# Patient Record
Sex: Female | Born: 1975 | Hispanic: Yes | Marital: Married | Smoking: Never smoker
Health system: Southern US, Community
[De-identification: ages and names within clinical notes are randomized; demographics above are authoritative.]

## PROBLEM LIST (undated history)

## (undated) DIAGNOSIS — I10 Essential (primary) hypertension: Secondary | ICD-10-CM

## (undated) DIAGNOSIS — R519 Headache, unspecified: Secondary | ICD-10-CM

## (undated) DIAGNOSIS — G8929 Other chronic pain: Secondary | ICD-10-CM

## (undated) HISTORY — PX: TUBAL LIGATION: SHX77

## (undated) HISTORY — DX: Other chronic pain: G89.29

## (undated) HISTORY — DX: Headache, unspecified: R51.9

## (undated) HISTORY — DX: Essential (primary) hypertension: I10

---

## 1997-12-13 ENCOUNTER — Encounter: Admission: RE | Admit: 1997-12-13 | Discharge: 1998-03-13 | Payer: Self-pay | Admitting: *Deleted

## 1998-04-12 ENCOUNTER — Encounter (HOSPITAL_COMMUNITY): Admission: RE | Admit: 1998-04-12 | Discharge: 1998-07-11 | Payer: Self-pay | Admitting: *Deleted

## 1998-07-22 ENCOUNTER — Encounter (HOSPITAL_COMMUNITY): Admission: RE | Admit: 1998-07-22 | Discharge: 1998-10-20 | Payer: Self-pay | Admitting: *Deleted

## 1998-11-12 ENCOUNTER — Encounter (HOSPITAL_COMMUNITY): Admission: RE | Admit: 1998-11-12 | Discharge: 1999-02-10 | Payer: Self-pay | Admitting: *Deleted

## 1999-01-12 ENCOUNTER — Other Ambulatory Visit: Admission: RE | Admit: 1999-01-12 | Discharge: 1999-01-12 | Payer: Self-pay | Admitting: Gynecology

## 2001-03-19 ENCOUNTER — Other Ambulatory Visit: Admission: RE | Admit: 2001-03-19 | Discharge: 2001-03-19 | Payer: Self-pay | Admitting: Gynecology

## 2001-03-26 ENCOUNTER — Encounter: Admission: RE | Admit: 2001-03-26 | Discharge: 2001-06-24 | Payer: Self-pay | Admitting: Gynecology

## 2001-11-07 ENCOUNTER — Inpatient Hospital Stay (HOSPITAL_COMMUNITY): Admission: AD | Admit: 2001-11-07 | Discharge: 2001-11-09 | Payer: Self-pay | Admitting: Gynecology

## 2001-11-10 ENCOUNTER — Encounter: Admission: RE | Admit: 2001-11-10 | Discharge: 2001-12-10 | Payer: Self-pay | Admitting: Gynecology

## 2001-12-11 ENCOUNTER — Encounter: Admission: RE | Admit: 2001-12-11 | Discharge: 2002-01-10 | Payer: Self-pay | Admitting: Gynecology

## 2001-12-19 ENCOUNTER — Other Ambulatory Visit: Admission: RE | Admit: 2001-12-19 | Discharge: 2001-12-19 | Payer: Self-pay | Admitting: Gynecology

## 2002-01-11 ENCOUNTER — Encounter: Admission: RE | Admit: 2002-01-11 | Discharge: 2002-02-10 | Payer: Self-pay | Admitting: Gynecology

## 2002-02-11 ENCOUNTER — Encounter: Admission: RE | Admit: 2002-02-11 | Discharge: 2002-03-13 | Payer: Self-pay | Admitting: Gynecology

## 2002-04-13 ENCOUNTER — Encounter: Admission: RE | Admit: 2002-04-13 | Discharge: 2002-05-13 | Payer: Self-pay | Admitting: Gynecology

## 2002-06-13 ENCOUNTER — Encounter: Admission: RE | Admit: 2002-06-13 | Discharge: 2002-07-13 | Payer: Self-pay | Admitting: Gynecology

## 2002-07-14 ENCOUNTER — Encounter: Admission: RE | Admit: 2002-07-14 | Discharge: 2002-08-13 | Payer: Self-pay | Admitting: Gynecology

## 2003-12-02 ENCOUNTER — Other Ambulatory Visit: Admission: RE | Admit: 2003-12-02 | Discharge: 2003-12-02 | Payer: Self-pay | Admitting: Obstetrics and Gynecology

## 2005-03-15 ENCOUNTER — Other Ambulatory Visit: Admission: RE | Admit: 2005-03-15 | Discharge: 2005-03-15 | Payer: Self-pay | Admitting: Obstetrics and Gynecology

## 2005-10-12 ENCOUNTER — Other Ambulatory Visit: Admission: RE | Admit: 2005-10-12 | Discharge: 2005-10-12 | Payer: Self-pay | Admitting: Gynecology

## 2006-10-15 ENCOUNTER — Other Ambulatory Visit: Admission: RE | Admit: 2006-10-15 | Discharge: 2006-10-15 | Payer: Self-pay | Admitting: Gynecology

## 2008-09-07 ENCOUNTER — Encounter: Payer: Self-pay | Admitting: Gynecology

## 2008-09-07 ENCOUNTER — Ambulatory Visit: Payer: Self-pay | Admitting: Gynecology

## 2008-09-07 ENCOUNTER — Other Ambulatory Visit: Admission: RE | Admit: 2008-09-07 | Discharge: 2008-09-07 | Payer: Self-pay | Admitting: Gynecology

## 2009-01-04 ENCOUNTER — Ambulatory Visit: Payer: Self-pay | Admitting: Gynecology

## 2009-01-18 ENCOUNTER — Ambulatory Visit: Payer: Self-pay | Admitting: Gynecology

## 2009-07-29 ENCOUNTER — Inpatient Hospital Stay (HOSPITAL_COMMUNITY): Admission: AD | Admit: 2009-07-29 | Discharge: 2009-07-29 | Payer: Self-pay | Admitting: Obstetrics and Gynecology

## 2009-08-15 ENCOUNTER — Inpatient Hospital Stay (HOSPITAL_COMMUNITY): Admission: RE | Admit: 2009-08-15 | Discharge: 2009-08-17 | Payer: Self-pay | Admitting: Obstetrics and Gynecology

## 2011-02-15 LAB — CBC
HCT: 33.4 % — ABNORMAL LOW (ref 36.0–46.0)
HCT: 40.2 % (ref 36.0–46.0)
Hemoglobin: 11.5 g/dL — ABNORMAL LOW (ref 12.0–15.0)
Hemoglobin: 13.7 g/dL (ref 12.0–15.0)
MCHC: 34.3 g/dL (ref 30.0–36.0)
MCV: 90.1 fL (ref 78.0–100.0)
Platelets: 177 K/uL (ref 150–400)
RBC: 3.71 MIL/uL — ABNORMAL LOW (ref 3.87–5.11)
RDW: 13.3 % (ref 11.5–15.5)
RDW: 13.4 % (ref 11.5–15.5)
WBC: 11.7 K/uL — ABNORMAL HIGH (ref 4.0–10.5)
WBC: 9.4 10*3/uL (ref 4.0–10.5)

## 2011-02-15 LAB — SYPHILIS: RPR W/REFLEX TO RPR TITER AND TREPONEMAL ANTIBODIES, TRADITIONAL SCREENING AND DIAGNOSIS ALGORITHM: RPR Ser Ql: NONREACTIVE

## 2011-02-16 LAB — COMPREHENSIVE METABOLIC PANEL
AST: 24 U/L (ref 0–37)
Albumin: 2.9 g/dL — ABNORMAL LOW (ref 3.5–5.2)
Calcium: 8.7 mg/dL (ref 8.4–10.5)
Creatinine, Ser: 0.39 mg/dL — ABNORMAL LOW (ref 0.4–1.2)
GFR calc Af Amer: >60 mL/min (ref >60)

## 2011-02-16 LAB — CBC
MCHC: 34.3 g/dL (ref 30.0–36.0)
MCV: 90 fL (ref 78.0–100.0)
Platelets: 200 10*3/uL (ref 150–400)
RDW: 13.4 % (ref 11.5–15.5)

## 2011-02-16 LAB — URIC ACID: Uric Acid, Serum: 4.3 mg/dL (ref 2.4–7.0)

## 2011-03-30 NOTE — H&P (Signed)
NAMESHARMANE, DAME NO.:  [id number]   MEDICAL RECORD NO.:  [id number]         PATIENT TYPE:  WINP   LOCATION:  147                           FACILITY:  [facility]   PHYSICIAN:  Dineen Kid. Rana Snare, M.D.    DATE OF BIRTH:  [birth year]   DATE OF ADMISSION:  08/15/2009  DATE OF DISCHARGE:                              HISTORY & PHYSICAL   HISTORY OF PRESENT ILLNESS:  Ms. Jasmine Dominguez is a 35 year old G4, P3 with a  estimated date confinement of August 25, 2009.  She presented to my  office this afternoon complaining of regular strong contractions every 5-  10 minutes apart to the point of being unable to talk and having to  breathe through the contractions.  On examination, her cervix was  changed to 2-3 cm, 50% effaced, previously it was closed.  Contractions  have been going on most of the day and were getting stronger and closer.  She had 3 previous cesarean sections, desires repeat.  She also has  multiparity, desires sterility.  She presents for repeat cesarean  section and tubal ligation for labor.  Her pregnancy was uncomplicated.   PAST SURGICAL HISTORY:  Significant for 3 cesarean sections by Dr.  Lily Peer.  The first for failure to progress.   She is negative GBS.   Her blood pressure is 110/64.  Heart is regular rate and rhythm.  Lungs  clear to auscultation bilaterally.  Abdomen is gravid, nontender.  Cervix is 350%, -2 station.   IMPRESSION AND PLAN:  Previous cesarean section x3 with desires repeat,  labor and pregnancy at 38-1/2 weeks' gestational age, also multiparity  desires sterility.   PLAN:  Repeat low segment transverse cesarean section and tubal ligation  by Filshie clip.  Discussed the risks and benefits of procedure at  length, which include but not limited to risk of infection, bleeding,  damage to the uterus, tubes, ovary, bowel, bladder, fetus.  Risk of  tubal failure quoted at 03/999.  She does give an informed consent and  wishes to  proceed.      Dineen Kid Rana Snare, M.D.  Electronically Signed     DCL/MEDQ  D:  08/15/2009  T:  08/16/2009  Job:  [id number]

## 2011-03-30 NOTE — Op Note (Signed)
[facility]  Patient:    Jasmine Dominguez, Jasmine Dominguez Visit Number: [id number] MRN: [id number]          Service Type: OBS Location: 910A 9107 [id number] Attending Physician:  Tonye Royalty Dictated by:   Gaetano Hawthorne. Lily Peer, M.D. Admit Date:  11/07/2001                             Operative Report  SURGEON:                      Juan H. Lily Peer, M.D.  FIRST ASSISTANT:              Katy Fitch, M.D.  INDICATIONS FOR PROCEDURE:    A 35 year old gravida 3, para 2, AB 1 at 57 weeks estimated gestational age with previous low vertical cesarean section delivered at 29 weeks secondary to preeclampsia/toxemia.  PREOPERATIVE DIAGNOSES:       1. Term intrauterine pregnancy at 41 weeks                                  estimated gestational age.                               2. Previous low vertical cesarean section for                                  repeat.  POSTOPERATIVE DIAGNOSES:      1. Term intrauterine pregnancy at 5 weeks                                  estimated gestational age.                               2. Previous low vertical cesarean section for                                  repeat.  ANESTHESIA:                   Spinal.  PROCEDURE:                    Repeat low uterine segment transverse cesarean section.  FINDINGS:                     Viable female infant.  Apgars 8 and 9.  Weight 6 lb 9 oz.  Pelvic adhesions consisting of omentum adhered to the anterior peritoneal surface.  Normal maternal tubes and ovaries.  Clear amniotic fluid.  DESCRIPTION OF PROCEDURE:     After the patient was adequately counseled, she was taken to the operating room, where she underwent spinal anesthesia.  A Foley was inserted to monitor urinary output.  The abdomen was prepped and draped in the usual sterile fashion.  A Pfannenstiel skin incision was made adjacent to the previous cesarean section scar.  The Pfannenstiel incision was extended through the  skin and subcutaneous tissue to the rectus fascia, where a midline nick was made.  The fascia  was incised in a transverse fashion.  The peritoneal cavity was entered cautiously.  A bladder flap was established. Upon entering the peritoneal cavity, meticulous dissection was required to remove a small segment of the omentum, which was adhered to the anterior peritoneal surface.  Once the lower uterine segment was incised, clear amniotic fluid was present.  The newborn was delivered without any complications.  The nasopharyngeal area was bulb suctioned.  The cord was doubly clamped and excised.  The newborn gave an immediate cry, was suctioned on the peritoneum and handed off to the pediatricians, who gave the above-mentioned parameters.  Cefotan 1 g IV was administered.  The placenta was delivered from the intrauterine cavity.  The uterus was exteriorized.  The intrauterine cavity was swept clear of any remaining products of conception. The lower uterine segment transverse incision was closed with a running stitch of 0 Vicryl suture.  Both tubes and ovaries were normal.  The uterus was placed back into the abdominal cavity.  The pelvic cavity was copiously irrigated with normal saline solution.  After ascertaining adequate hemostasis, closure was commenced.  The visceral peritoneum was now reapproximated.  The rectus fascia was closed with a running stitch of 0 Vicryl suture.  Subcutaneous bleeders were cauterized.  The skin was reapproximated with skin clips, followed by Xeroform gauze and 4 x dressing. The patient was transferred to the recovery room with stable vital signs. BLOOD LOSS:                   600 cc.  IV FLUIDS:                    3300 cc lactated Ringers.  URINE OUTPUT:                 350 cc clear. Dictated by:   Gaetano Hawthorne Lily Peer, M.D. Attending Physician:  Tonye Royalty DD:  11/07/01 TD:  11/07/01 Job: [id number]

## 2011-03-30 NOTE — Discharge Summary (Signed)
[facility]  Patient:    Jasmine Dominguez, GARROTT Visit Number: [id number] MRN: [id number]          Service Type: OBS Location: 910A 9107 01 Attending Physician:  Tonye Royalty Dictated by:   Antony Contras, [facility] Admit Date:  11/07/2001 Discharge Date: 11/09/2001                             Discharge Summary  DISCHARGE DIAGNOSES:          1. Term intrauterine pregnancy at 39 weeks.                               2. Previous low vertical cesarean section with                                  delivery of viable infant.  HISTORY OF PRESENT ILLNESS:   The patient is a 35 year old gravida 3, para 1-1-0-2, LMP January 23, 2001, [facility] November 15, 2001 by ultrasound.  Risk factors include history of previous low cervical transverse cesarean section.  LABS:                         Blood type 0 positive, antibody screen negative. RPR, HBSAG, HIV nonreactive.  Rubella nonimmune.  MSAFP normal.  HOSPITAL COURSE/TREATMENT:    The patient was admitted on November 07, 2001. Repeat low uterine segment transverse cesarean sections were performed by Dr. Lily Peer, assisted by Dr. Katy Fitch.  Findings included viable female infant with Apgars of 8 and 9, weight 6 pounds 9 ounces.  Pelvic adhesions consisting of omentum adhering to the anterior peritoneal surface. Normal maternal tubes and ovaries.  Clear amniotic fluid.  Postpartum course the patient remained afebrile and had no difficulty voiding. She was able to be discharged in satisfactory condition on the second postpartum day.  DISCHARGE LABS:               CBC -- Hematocrit 30.1, hemoglobin 10.6, platelets 202.  DISPOSITION:                  Follow-up on November 11, 2001 in the office for removal of staples, then four to six weeks postpartum examination.  Continue with prenatal vitamins and iron. Dictated by:   Antony Contras, [facility] Attending Physician:  Tonye Royalty DD:   11/26/01 TD:  11/26/01 Job: [id number]

## 2011-03-30 NOTE — H&P (Signed)
[facility]  Patient:    Jasmine Dominguez, Jasmine Dominguez Visit Number: [id number] MRN: [id number]          Service Type: Attending:  Gaetano Hawthorne. Lily Peer, M.D. Dictated by:   Gaetano Hawthorne Lily Peer, M.D. Adm. Date:  11/07/01                           History and Physical  CHIEF COMPLAINT:              1. Term intrauterine pregnancy at 76 weeks                                  estimated gestational age.                               2. Previous low vertical cesarean section for                                  repeat.  HISTORY:                      The patient is a 35 year old gravida 3, para 1 with corrected estimated date of confinement November 15, 2001.  Patient will be 39 weeks at the time of her cesarean section.  The reason for patients repeat cesarean section is due to the fact that her last pregnancy due to severe preeclampsia and toxemia at [redacted] weeks gestation had an emergency cesarean section and was a low vertical incision.  Her previous pregnancy prior to that was a normal spontaneous vaginal delivery.  This pregnancy her prenatal course has essentially been unremarkable.  Had normal weight gain of 20 pounds and her blood sugars were fine and GBS culture was negative.  PAST MEDICAL HISTORY:         1. As per above.                               2. [facility] in 1996.  ALLERGIES:                    Denies.  REVIEW OF SYSTEMS:            See ______ form.  PHYSICAL EXAMINATION  VITAL SIGNS:                  Blood pressure 120/74, weight 180 pounds.  LABORATORIES:                 Urine negative for protein and glucose.  HEENT:                        Unremarkable.  NECK:                         Supple.  Trachea midline.  No carotid bruits. No thyromegaly.  LUNGS:                        Clear to auscultation without rhonchi or wheezes.  HEART:  Regular rate and rhythm.  No murmurs or gallops.  BREASTS:                      Not  done.  ABDOMEN:                      Gravid uterus.  Midline scar was noted. Positive fetal heart tones.  PELVIC:                       Cervix:  1 cm, 50% effaced, -3 station.  EXTREMITIES:                  DTRs 1+.  Negative clonus.  Trace edema.  PRENATAL LABORATORIES:        O+ blood type.  Negative antibody screen.  VDRL was nonreactive.  Rubella immune.  Hepatitis B surface antigen, HIV were negative.  Alpha fetoprotein was normal.  Diabetes screen was normal.  GBS culture was negative.  Pap smear was normal.  LABORATORIES:                 Urine negative for protein and glucose.  ASSESSMENT:                   A 35 year old gravida 3, para 1 at [redacted] weeks gestation at time of her scheduled repeat cesarean section on December 27. Reason for repeat cesarean section is due to the fact that her last pregnancy ended up in emergency cesarean section at [redacted] weeks gestation for severe preeclampsia.  Risks, benefits, pros, and cons of the operation were discussed with the patient to include infection, bleeding, trauma to internal organs, and trauma to the fetus.  She is not interested in having permanent sterilization.  All of the above instructions were provided to the patient in Spanish.  All questions were answered.  Will follow accordingly.  PLAN:                         Patient is scheduled for repeat cesarean section on Friday, December 27 at 1 p.m. at [facility]. Dictated by:   Gaetano Hawthorne. Lily Peer, M.D. Attending:  Gaetano Hawthorne. Lily Peer, M.D. DD:  10/31/01 TD:  10/31/01 Job: [id number]

## 2012-10-06 ENCOUNTER — Ambulatory Visit: Payer: Self-pay | Admitting: Gynecology

## 2013-12-30 ENCOUNTER — Other Ambulatory Visit (HOSPITAL_COMMUNITY)
Admission: RE | Admit: 2013-12-30 | Discharge: 2013-12-30 | Disposition: A | Discharge status: Home / Self Care | Payer: Self-pay | Source: Ambulatory Visit | Attending: Gynecology | Admitting: Gynecology

## 2013-12-30 ENCOUNTER — Ambulatory Visit (INDEPENDENT_AMBULATORY_CARE_PROVIDER_SITE_OTHER): Payer: Self-pay | Admitting: Gynecology

## 2013-12-30 ENCOUNTER — Encounter: Payer: Self-pay | Admitting: Gynecology

## 2013-12-30 VITALS — BP 130/86 | Ht 60.5 in | Wt 172.8 lb

## 2013-12-30 DIAGNOSIS — Z1151 Encounter for screening for human papillomavirus (HPV): Secondary | ICD-10-CM | POA: Insufficient documentation

## 2013-12-30 DIAGNOSIS — R635 Abnormal weight gain: Secondary | ICD-10-CM

## 2013-12-30 DIAGNOSIS — Z01419 Encounter for gynecological examination (general) (routine) without abnormal findings: Secondary | ICD-10-CM | POA: Insufficient documentation

## 2013-12-30 LAB — COMPREHENSIVE METABOLIC PANEL
ALK PHOS: 78 U/L (ref 39–117)
ALT: 14 U/L (ref 0–35)
AST: 14 U/L (ref 0–37)
Albumin: 4.3 g/dL (ref 3.5–5.2)
BILIRUBIN TOTAL: 0.5 mg/dL (ref 0.2–1.2)
BUN: 8 mg/dL (ref 6–23)
CO2: 27 mEq/L (ref 19–32)
Calcium: 8.7 mg/dL (ref 8.4–10.5)
Chloride: 102 mEq/L (ref 96–112)
Creat: 0.45 mg/dL — ABNORMAL LOW (ref 0.50–1.10)
GLUCOSE: 79 mg/dL (ref 70–99)
Potassium: 3.8 mEq/L (ref 3.5–5.3)
SODIUM: 136 meq/L (ref 135–145)
TOTAL PROTEIN: 7.3 g/dL (ref 6.0–8.3)

## 2013-12-30 LAB — CBC WITH DIFFERENTIAL/PLATELET
BASOS ABS: 0 10*3/uL (ref 0.0–0.1)
BASOS PCT: 0 % (ref 0–1)
EOS ABS: 0.1 10*3/uL (ref 0.0–0.7)
Eosinophils Relative: 2 % (ref 0–5)
HCT: 38.6 % (ref 36.0–46.0)
HEMOGLOBIN: 12.7 g/dL (ref 12.0–15.0)
Lymphocytes Relative: 26 % (ref 12–46)
Lymphs Abs: 1.4 10*3/uL (ref 0.7–4.0)
MCH: 28.6 pg (ref 26.0–34.0)
MCHC: 32.9 g/dL (ref 30.0–36.0)
MCV: 86.9 fL (ref 78.0–100.0)
MONOS PCT: 7 % (ref 3–12)
Monocytes Absolute: 0.4 10*3/uL (ref 0.1–1.0)
NEUTROS PCT: 65 % (ref 43–77)
Neutro Abs: 3.6 10*3/uL (ref 1.7–7.7)
Platelets: 311 10*3/uL (ref 150–400)
RBC: 4.44 MIL/uL (ref 3.87–5.11)
RDW: 14.5 % (ref 11.5–15.5)
WBC: 5.5 10*3/uL (ref 4.0–10.5)

## 2013-12-30 LAB — LIPID PANEL
Cholesterol: 164 mg/dL (ref 0–200)
HDL: 44 mg/dL (ref >39)
LDL CALC: 92 mg/dL (ref 0–99)
TRIGLYCERIDES: 139 mg/dL (ref <150)
Total CHOL/HDL Ratio: 3.7 Ratio
VLDL: 28 mg/dL (ref 0–40)

## 2013-12-30 LAB — TSH: TSH: 1.075 u[IU]/mL (ref 0.350–4.500)

## 2013-12-30 NOTE — Patient Instructions (Signed)
Autoexamen de mamas  (Breast Self-Awareness) El autoexamen de mamas puede detectar problemas de manera temprana, prevenir complicaciones mdicas significativas y posiblemente salvar su vida. Al hacerlo, podr familiarizarse con el aspecto y forma de sus mamas, y observar cambios. Esto le permite descubrir cambios de manera precoz. Este autoexamen mensual le ofrece la tranquilidad de que sus senos estn en buen estado de salud. Una forma de aprender qu es normal para sus mamas y si sufren modificaciones es hacer un autoexamen.   Si encuentra un bulto o algo que no estaba presente anteriormente, lo mejor es ponerse en contacto con su mdico inmediatamente. Otro hallazgo que debe ser evaluado por su mdico es la secrecin del pezn, especialmente si es con sangre; cambios en la piel o enrojecimiento; reas donde la piel parece estar tironeada (retrada) o nuevos bultos o protuberancias. El dolor en los senos es rara vez se asocia con el cncer (malignidad), pero tambin debe ser evaluado por un mdico.  CMO REALIZAR EL AUTOEXAMEN DE MAMAS  El mejor momento para examinar sus mamas es a los 5 a 7 das despus de finalizado el perodo menstrual. Durante la menstruacin, las mamas estn ms abultadas y puede haber ms dificultad para encontrar modificaciones. Si no menstra, ha llegado a la menopausia, o le han extirpado el tero (histerectomia), usted debe examinar sus senos a intervalos regulares, por ejemplo cada mes. Si est amamantando, examine sus senos despus de alimentar al beb o despus de usar un extractor de leche. Los implantes mamarios no disminuyen el riesgo de bultos o tumores, por lo que debe seguir realizando el autoexamen de mamas como se recomienda. Hable con su mdico acerca de cmo determinar la diferencia entre el implante y el tejido mamario. Adems, debe consultar cuanta presin debe hacer durante el examen. Con el tiempo se familiarizar con las variaciones de las mamas y se sentir ms  cmoda para realizar el examen. Para el autoexamen deber quitarse toda la ropa de la cintura para arriba.  1.  Observe sus senos y pezones. Prese frente a un espejo en una habitacin con buena iluminacin. Con las manos en las caderas, presione las manos firmemente hacia abajo. Busque diferencias en la forma, el contorno y el tamao de un pecho al otro (asimetras). Entre las asimetras se incluyen arrugas, depresiones o protuberancias. Tambin, busque cambios en la piel, como reas enrojecidas o escamosas. Busque cambios en los pezones, como secreciones, hoyuelos, cambios en la posicin, o enrojecimiento. 2. Palpe cuidadosamente sus senos. Es mucho mejor hacerlo en la ducha o en la baera, mientras usa jabn o cuando est recostada sobre su espalda. Coloque el brazo (en el lado de la mama que se examina) por arriba de la cabeza. Use las yemas (no las puntas) de los tres dedos centrales de la mano opuesta para palpar. Comience en la zona de la axila, haga crculos de  de pulgada (2 cm) y vaya superponindolos. Utilice 3 niveles diferentes de presin (ligero, medio y firme) en cada crculo antes de pasar al siguiente. Se necesita una presin ligera para sentir los tejidos ms cercanos a la piel. La presin media ayudar a sentir el tejido mamario un poco ms profundo, mientras que se necesita una presin firme para palpar el tejido que se encuentra cerca de las costillas. Continuar superponiendo crculos y vaya hacia abajo, hasta sentir las costillas, por debajo del pecho. Luego mueva un espacio del ancho de un dedo hacia el centro del cuerpo. Siga con los crculos del  de pulgada (  2 cm) mientras va lentamente hacia la clavcula, cerca de la base del cuello. Contine con el examen hacia arriba y hacia abajo con las 3 intensidades de presin hasta llegar a la mitad del pecho. Hgalo con cada seno cuidadosamente, buscando bultos o modificaciones. 3. Debe llevar un registro escrito con los cambios o los hallazgos  normales que encuentre para cada seno. Si registra esta informacin, no tiene que depender slo de la memoria para recordar el tamao, la sensibilidad o la ubicacin de los hallazgos. Anote en qu momento se encuentra del ciclo menstrual, si usted todava est menstruando. El tejido mamario puede tener algunos bultos o tejidos engrosados. Sin embargo, consulte a su mdico si usted encuentra algo que le preocupa.   SOLICITE ATENCIN MDICA SI:   Observa cambios en la forma, en el contorno o el tamao de las mamas o los pezones.   Hay modificaciones en la piel, como zonas enrojecidas o escamosas en las mamas o en los pezones.   Tiene una secrecin anormal en los pezones.   Siente un nuevo bulto o reas engrosadas de manera anormal.  Document Released: 10/29/2005 Document Revised: 10/15/2012 [employer] Patient Information 2014 [employer].  

## 2013-12-30 NOTE — Progress Notes (Signed)
Jasmine Dominguez [birth year] [id number]   History:    38 y.o.  for annual gyn exam without any complaints. Patient has not been seen in the office in over 4 years. She is a gravida 4 para 4 (1 normal spontaneous vaginal delivery followed by 3 cesarean sections whereby the last one resulted in a tubal sterilization). Patient denied any prior history of abnormal Pap smear. Was complaining only of some weight gain. Normal menstrual cycles reported. Patient not interested in the flu vaccine.  Past medical history,surgical history, family history and social history were all reviewed and documented in the EPIC chart.  Gynecologic History Patient's last menstrual period was 12/19/2013. Contraception: tubal ligation Last Pap: Over 4 years ago. Results were: normal Last mammogram: Not indicated. Results were: Not indicated  Obstetric History OB History  Gravida Para Term Preterm AB SAB TAB Ectopic Multiple Living  4 4        4     # Outcome Date GA Lbr Len/2nd Weight Sex Delivery Anes PTL Lv  4 PAR           3 PAR           2 PAR           1 PAR                ROS: A ROS was performed and pertinent positives and negatives are included in the history.  GENERAL: No fevers or chills. HEENT: No change in vision, no earache, sore throat or sinus congestion. NECK: No pain or stiffness. CARDIOVASCULAR: No chest pain or pressure. No palpitations. PULMONARY: No shortness of breath, cough or wheeze. GASTROINTESTINAL: No abdominal pain, nausea, vomiting or diarrhea, melena or bright red blood per rectum. GENITOURINARY: No urinary frequency, urgency, hesitancy or dysuria. MUSCULOSKELETAL: No joint or muscle pain, no back pain, no recent trauma. DERMATOLOGIC: No rash, no itching, no lesions. ENDOCRINE: No polyuria, polydipsia, no heat or cold intolerance. No recent change in weight. HEMATOLOGICAL: No anemia or easy bruising or bleeding. NEUROLOGIC: No headache, seizures, numbness, tingling or weakness.  PSYCHIATRIC: No depression, no loss of interest in normal activity or change in sleep pattern.     Exam: chaperone present  BP 130/86  Ht 5' 0.5" (1.537 m)  Wt 172 lb 12.8 oz (78.382 kg)  BMI 33.18 kg/m2  LMP 12/19/2013  Body mass index is 33.18 kg/(m^2).  General appearance : Well developed well nourished female. No acute distress HEENT: Neck supple, trachea midline, no carotid bruits, no thyroidmegaly Lungs: Clear to auscultation, no rhonchi or wheezes, or rib retractions  Heart: Regular rate and rhythm, no murmurs or gallops Breast:Examined in sitting and supine position were symmetrical in appearance, no palpable masses or tenderness,  no skin retraction, no nipple inversion, no nipple discharge, no skin discoloration, no axillary or supraclavicular lymphadenopathy Abdomen: no palpable masses or tenderness, no rebound or guarding Extremities: no edema or skin discoloration or tenderness  Pelvic:  Bartholin, Urethra, Skene Glands: Within normal limits             Vagina: No gross lesions or discharge  Cervix: No gross lesions or discharge  Uterus  anteverted, normal size, shape and consistency, non-tender and mobile  Adnexa  Without masses or tenderness  Anus and perineum  normal   Rectovaginal  normal sphincter tone without palpated masses or tenderness             Hemoccult not indicated     Assessment/Plan:  38  y.o. female for annual exam who was reminded today on the importance of monthly self breast exam. Patient declined flu vaccine. The following labs were ordered and a fasting state: Fasting lipid profile, comprehensive metabolic panel, TSH, CBC, and urinalysis. Pap smear was done today.  Note: This dictation was prepared with  Dragon/digital dictation along withSmart phrase technology. Any transcriptional errors that result from this process are unintentional.   Ok EdwardsFERNANDEZ,JUAN H MD, 11:22 AM 12/30/2013

## 2013-12-31 LAB — URINALYSIS W MICROSCOPIC + REFLEX CULTURE
BILIRUBIN URINE: NEGATIVE
Casts: NONE SEEN
Crystals: NONE SEEN
Glucose, UA: NEGATIVE mg/dL
KETONES UR: NEGATIVE mg/dL
Leukocytes, UA: NEGATIVE
Nitrite: NEGATIVE
PROTEIN: NEGATIVE mg/dL
Specific Gravity, Urine: 1.016 (ref 1.005–1.030)
UROBILINOGEN UA: 0.2 mg/dL (ref 0.0–1.0)
pH: 8 (ref 5.0–8.0)

## 2014-01-01 ENCOUNTER — Other Ambulatory Visit: Payer: Self-pay | Admitting: Gynecology

## 2014-01-01 MED ORDER — NITROFURANTOIN MONOHYD MACRO 100 MG PO CAPS: 100.0000 mg | ORAL_CAPSULE | Freq: Two times a day (BID) | ORAL | Status: DC

## 2014-01-02 LAB — URINE CULTURE

## 2014-09-13 ENCOUNTER — Encounter: Payer: Self-pay | Admitting: Gynecology

## 2017-03-27 ENCOUNTER — Encounter: Payer: Self-pay | Admitting: Gynecology

## 2019-06-09 ENCOUNTER — Other Ambulatory Visit: Payer: Self-pay

## 2019-06-09 DIAGNOSIS — Z20822 Contact with and (suspected) exposure to covid-19: Secondary | ICD-10-CM

## 2019-06-11 LAB — NOVEL CORONAVIRUS, NAA: SARS-CoV-2, NAA: NOT DETECTED

## 2021-02-22 ENCOUNTER — Encounter (HOSPITAL_COMMUNITY): Payer: Self-pay

## 2021-02-22 ENCOUNTER — Other Ambulatory Visit: Payer: Self-pay

## 2021-02-22 ENCOUNTER — Ambulatory Visit (HOSPITAL_COMMUNITY)
Admission: EM | Admit: 2021-02-22 | Discharge: 2021-02-22 | Disposition: A | Discharge status: Home / Self Care | Payer: 59

## 2021-02-22 DIAGNOSIS — J209 Acute bronchitis, unspecified: Secondary | ICD-10-CM

## 2021-02-22 DIAGNOSIS — J3089 Other allergic rhinitis: Secondary | ICD-10-CM

## 2021-02-22 MED ORDER — PROMETHAZINE-DM 6.25-15 MG/5ML PO SYRP: 5.0000 mL | ORAL_SOLUTION | Freq: Four times a day (QID) | ORAL | 0 refills | Status: DC | PRN

## 2021-02-22 MED ORDER — PREDNISONE 20 MG PO TABS: 40.0000 mg | ORAL_TABLET | Freq: Every day | ORAL | 0 refills | Status: DC

## 2021-02-22 MED ORDER — CETIRIZINE HCL 10 MG PO TABS: 10.0000 mg | ORAL_TABLET | Freq: Every day | ORAL | 2 refills | Status: DC

## 2021-02-22 NOTE — ED Triage Notes (Signed)
Pt c/o a cough x 4 months. She states she was COVID positive in January after coming back from [place]. Pt states she was given medication that helped. She states the cough went away and came back. She states she left to [place] again, came back and has been coughing ever since. She states she has been using an inhaler.

## 2021-02-23 NOTE — ED Provider Notes (Signed)
[facility]    CSN: [id number] Arrival date & time: 02/22/21  1826      History   Chief Complaint Chief Complaint  Patient presents with  . Cough    HPI Jasmine Dominguez is a 45 y.o. female.   Patient presenting today with a persistent cough x4 months.  She states that she did have Covid after coming back from [place] in January and the cough never went away.  Worse first thing in the morning and at night, sometimes dry, sometimes productive.  Also has some postnasal drainage associated with the cough.  Denies fever, chills, chest pain, shortness of breath, palpitations.  Has tried numerous over-the-counter remedies such as Delsym, Robitussin, DayQuil, NyQuil without benefit.  No known history of chronic cardiopulmonary or allergic conditions.  No new sick contacts.    Past Medical History:  Diagnosis Date  . NSVD (normal spontaneous vaginal delivery)     There are no problems to display for this patient.   Past Surgical History:  Procedure Laterality Date  . CESAREAN SECTION     X 3... severe preeclampsia 29 wks  . TUBAL LIGATION      OB History    Gravida  4   Para  4   Term      Preterm      AB      Living  4     SAB      IAB      Ectopic      Multiple      Live Births               Home Medications    Prior to Admission medications   Medication Sig Start Date End Date Taking? Authorizing Provider  cetirizine (ZYRTEC ALLERGY) 10 MG tablet Take 1 tablet (10 mg total) by mouth daily. 02/22/21  Yes Particia Nearing, PA-C  hydrochlorothiazide (HYDRODIURIL) 25 MG tablet Take 25 mg by mouth daily.   Yes [provider]  predniSONE (DELTASONE) 20 MG tablet Take 2 tablets (40 mg total) by mouth daily with breakfast. 02/22/21  Yes Particia Nearing, PA-C  promethazine-dextromethorphan (PROMETHAZINE-DM) 6.25-15 MG/5ML syrup Take 5 mLs by mouth 4 (four) times daily as needed for cough. 02/22/21  Yes Particia Nearing,  PA-C  amLODipine (NORVASC) 5 MG tablet Take 1 tablet by mouth daily. 12/07/20   [provider]  azithromycin (ZITHROMAX) 250 MG tablet See admin instructions. follow package directions 12/07/20   [provider]  Multiple Vitamin (MULTIVITAMIN) tablet Take 1 tablet by mouth daily.    [provider]  nitrofurantoin, macrocrystal-monohydrate, (MACROBID) 100 MG capsule Take 1 capsule (100 mg total) by mouth 2 (two) times daily. 01/01/14   Ok Edwards, MD    Family History Family History  Problem Relation Age of Onset  . Diabetes Mother   . Hypertension Mother   . Diabetes Father   . Hypertension Father   . Heart disease Sister   . Heart disease Brother     Social History Social History   Tobacco Use  . Smoking status: Never Smoker  . Smokeless tobacco: Never Used  Substance Use Topics  . Alcohol use: No  . Drug use: No     Allergies   Patient has no known allergies.   Review of Systems Review of Systems Per HPI  Physical Exam Triage Vital Signs ED Triage Vitals  Enc Vitals Group     BP 02/22/21 1901 (!) 173/93  Pulse Rate 02/22/21 1856 95     Resp 02/22/21 1856 18     Temp --      Temp src --      SpO2 02/22/21 1856 100 %     Weight --      Height --      Head Circumference --      Peak Flow --      Pain Score 02/22/21 1853 6     Pain Loc --      Pain Edu? --      Excl. in GC? --    No data found.  Updated Vital Signs BP (!) 173/93 (BP Location: Right Arm)   Pulse 95   Resp 18   LMP 02/05/2021 (Exact Date)   SpO2 100%   Visual Acuity Right Eye Distance:   Left Eye Distance:   Bilateral Distance:    Right Eye Near:   Left Eye Near:    Bilateral Near:     Physical Exam Vitals and nursing note reviewed.  Constitutional:      Appearance: Normal appearance. She is not ill-appearing.  HENT:     Head: Atraumatic.     Right Ear: Tympanic membrane normal.     Left Ear: Tympanic membrane normal.     Nose:  Rhinorrhea present.     Comments: Bilateral nasal turbinates boggy, erythematous, edematous    Mouth/Throat:     Mouth: Mucous membranes are moist.     Pharynx: Oropharynx is clear. Posterior oropharyngeal erythema present.  Eyes:     Extraocular Movements: Extraocular movements intact.     Conjunctiva/sclera: Conjunctivae normal.  Cardiovascular:     Rate and Rhythm: Normal rate and regular rhythm.     Heart sounds: Normal heart sounds.  Pulmonary:     Effort: Pulmonary effort is normal. No respiratory distress.     Breath sounds: Normal breath sounds. No wheezing or rales.  Chest:     Chest wall: No tenderness.  Abdominal:     General: Bowel sounds are normal. There is no distension.     Palpations: Abdomen is soft.     Tenderness: There is no abdominal tenderness. There is no right CVA tenderness, left CVA tenderness or guarding.  Musculoskeletal:        General: Normal range of motion.     Cervical back: Normal range of motion and neck supple.  Skin:    General: Skin is warm and dry.  Neurological:     Mental Status: She is alert and oriented to person, place, and time.  Psychiatric:        Mood and Affect: Mood normal.        Thought Content: Thought content normal.        Judgment: Judgment normal.    UC Treatments / Results  Labs (all labs ordered are listed, but only abnormal results are displayed) Labs Reviewed - No data to display  EKG  Radiology No results found.  Procedures Procedures (including critical care time)  Medications Ordered in UC Medications - No data to display  Initial Impression / Assessment and Plan / UC Course  I have reviewed the triage vital signs and the nursing notes.  Pertinent labs & imaging results that were available during my care of the patient were reviewed by me and considered in my medical decision making (see chart for details).     Possibly a postinfectious cough post Covid but given the duration and seasonality  likely related to  allergic rhinitis, postnasal drip.  Mildly hypertensive today but otherwise vital signs including O2 saturation excellent within normal limits.  Lungs clear to auscultation bilaterally, heart sounds normal.  Will trial a prednisone burst, start good allergy regimen with Zyrtec and Flonase and give Phenergan DM for cough.  Did recommend that she follow-up with a primary care to establish care soon as possible for check of her elevated blood pressure and her chronic cough.  Information given on this and.  DASH diet, exercise, stress reduction reviewed and to check home readings if able.  Return for acutely worsening symptoms  Final Clinical Impressions(s) / UC Diagnoses   Final diagnoses:  Acute bronchitis, unspecified organism  Seasonal allergic rhinitis due to other allergic trigger   Discharge Instructions   None    ED Prescriptions    Medication Sig Dispense Auth. Provider   predniSONE (DELTASONE) 20 MG tablet Take 2 tablets (40 mg total) by mouth daily with breakfast. 10 tablet Particia Nearing, PA-C   cetirizine (ZYRTEC ALLERGY) 10 MG tablet Take 1 tablet (10 mg total) by mouth daily. 30 tablet Particia Nearing, [place]   promethazine-dextromethorphan (PROMETHAZINE-DM) 6.25-15 MG/5ML syrup Take 5 mLs by mouth 4 (four) times daily as needed for cough. 100 mL Particia Nearing, [place]     PDMP not reviewed this encounter.   Particia Nearing, [place] 02/23/21 1305

## 2021-03-24 ENCOUNTER — Ambulatory Visit (HOSPITAL_COMMUNITY)
Admission: EM | Admit: 2021-03-24 | Discharge: 2021-03-24 | Disposition: A | Discharge status: Home / Self Care | Payer: 59 | Attending: Student | Admitting: Student

## 2021-03-24 ENCOUNTER — Other Ambulatory Visit: Payer: Self-pay

## 2021-03-24 ENCOUNTER — Encounter (HOSPITAL_COMMUNITY): Payer: Self-pay | Admitting: Emergency Medicine

## 2021-03-24 DIAGNOSIS — Z8616 Personal history of COVID-19: Secondary | ICD-10-CM | POA: Diagnosis not present

## 2021-03-24 DIAGNOSIS — J209 Acute bronchitis, unspecified: Secondary | ICD-10-CM

## 2021-03-24 MED ORDER — PREDNISONE 20 MG PO TABS: 40.0000 mg | ORAL_TABLET | Freq: Every day | ORAL | 0 refills | Status: AC

## 2021-03-24 MED ORDER — ALBUTEROL SULFATE HFA 108 (90 BASE) MCG/ACT IN AERS: 1.0000 | INHALATION_SPRAY | Freq: Four times a day (QID) | RESPIRATORY_TRACT | 0 refills | Status: DC | PRN

## 2021-03-24 NOTE — ED Provider Notes (Signed)
[facility]    CSN: [id number] Arrival date & time: 03/24/21  1221      History   Chief Complaint Chief Complaint  Patient presents with  . Cough    HPI Jasmine Dominguez is a 45 y.o. female presenting with cough x10 days. Recurrent bronchitis and shortness of breath following covid diagnosis 11/2020.  Medical history hypertension, Covid 11/2020.  This patient states that she was initially evaluated for her symptoms when she was traveling in [place] 3/22, they did an x-ray and told her she had "swelling in her lungs".  States that she has been treated with multiple rounds of prednisone, this was last done at our urgent care 1 month ago with improvement in her symptoms at that time.  States she had relief for about 2 weeks, but the cough returned about 10 days ago.  Today she is presenting with 10 days of frequent productive cough with clear mucus.  Some shortness of breath with exertion.  Denies wheezing, dizziness, chest pain, nausea/vomiting/diarrhea.  States she is taking Zyrtec but does not think that she actually has allergies.  States hydrocodone cough syrup is the only thing that has helped her, and she thinks she needs an antibiotic today.  HPI  Past Medical History:  Diagnosis Date  . NSVD (normal spontaneous vaginal delivery)     There are no problems to display for this patient.   Past Surgical History:  Procedure Laterality Date  . CESAREAN SECTION     X 3... severe preeclampsia 29 wks  . TUBAL LIGATION      OB History    Gravida  4   Para  4   Term      Preterm      AB      Living  4     SAB      IAB      Ectopic      Multiple      Live Births               Home Medications    Prior to Admission medications   Medication Sig Start Date End Date Taking? Authorizing Provider  albuterol (VENTOLIN HFA) 108 (90 Base) MCG/ACT inhaler Inhale 1-2 puffs into the lungs every 6 (six) hours as needed for wheezing or shortness of breath.  03/24/21  Yes Rhys Martini, PA-C  predniSONE (DELTASONE) 20 MG tablet Take 2 tablets (40 mg total) by mouth daily for 5 days. 03/24/21 03/29/21 Yes Rhys Martini, PA-C  amLODipine (NORVASC) 5 MG tablet Take 1 tablet by mouth daily. 12/07/20   [provider]  azithromycin (ZITHROMAX) 250 MG tablet See admin instructions. follow package directions 12/07/20   [provider]  cetirizine (ZYRTEC ALLERGY) 10 MG tablet Take 1 tablet (10 mg total) by mouth daily. 02/22/21   Particia Nearing, PA-C  hydrochlorothiazide (HYDRODIURIL) 25 MG tablet Take 25 mg by mouth daily.    [provider]  Multiple Vitamin (MULTIVITAMIN) tablet Take 1 tablet by mouth daily.    [provider]  nitrofurantoin, macrocrystal-monohydrate, (MACROBID) 100 MG capsule Take 1 capsule (100 mg total) by mouth 2 (two) times daily. 01/01/14   Ok Edwards, MD  promethazine-dextromethorphan (PROMETHAZINE-DM) 6.25-15 MG/5ML syrup Take 5 mLs by mouth 4 (four) times daily as needed for cough. 02/22/21   Particia Nearing, PA-C    Family History Family History  Problem Relation Age of Onset  . Diabetes Mother   . Hypertension  Mother   . Diabetes Father   . Hypertension Father   . Heart disease Sister   . Heart disease Brother     Social History Social History   Tobacco Use  . Smoking status: Never Smoker  . Smokeless tobacco: Never Used  Substance Use Topics  . Alcohol use: No  . Drug use: No     Allergies   Patient has no known allergies.   Review of Systems Review of Systems  Constitutional: Negative for appetite change, chills and fever.  HENT: Positive for congestion. Negative for ear pain, rhinorrhea, sinus pressure, sinus pain and sore throat.   Eyes: Negative for redness and visual disturbance.  Respiratory: Positive for cough. Negative for chest tightness, shortness of breath and wheezing.   Cardiovascular: Negative for chest pain and palpitations.   Gastrointestinal: Negative for abdominal pain, constipation, diarrhea, nausea and vomiting.  Genitourinary: Negative for dysuria, frequency and urgency.  Musculoskeletal: Negative for myalgias.  Neurological: Negative for dizziness, weakness and headaches.  Psychiatric/Behavioral: Negative for confusion.  All other systems reviewed and are negative.    Physical Exam Triage Vital Signs ED Triage Vitals  Enc Vitals Group     BP 03/24/21 1333 137/81     Pulse Rate 03/24/21 1333 84     Resp 03/24/21 1333 17     Temp 03/24/21 1333 99.3 F (37.4 C)     Temp Source 03/24/21 1333 Oral     SpO2 03/24/21 1333 96 %     Weight --      Height --      Head Circumference --      Peak Flow --      Pain Score 03/24/21 1330 3     Pain Loc --      Pain Edu? --      Excl. in GC? --    No data found.  Updated Vital Signs BP 137/81 (BP Location: Right Arm)   Pulse 84   Temp 99.3 F (37.4 C) (Oral)   Resp 17   LMP 03/01/2021   SpO2 96%   Visual Acuity Right Eye Distance:   Left Eye Distance:   Bilateral Distance:    Right Eye Near:   Left Eye Near:    Bilateral Near:     Physical Exam Vitals reviewed.  Constitutional:      General: She is not in acute distress.    Appearance: Normal appearance. She is not ill-appearing.  HENT:     Head: Normocephalic and atraumatic.     Right Ear: Hearing, tympanic membrane, ear canal and external ear normal. No swelling or tenderness. There is no impacted cerumen. No mastoid tenderness. Tympanic membrane is not perforated, erythematous, retracted or bulging.     Left Ear: Hearing, tympanic membrane, ear canal and external ear normal. No swelling or tenderness. There is no impacted cerumen. No mastoid tenderness. Tympanic membrane is not perforated, erythematous, retracted or bulging.     Nose:     Right Sinus: No maxillary sinus tenderness or frontal sinus tenderness.     Left Sinus: No maxillary sinus tenderness or frontal sinus tenderness.      Mouth/Throat:     Mouth: Mucous membranes are moist.     Pharynx: Uvula midline. No oropharyngeal exudate or posterior oropharyngeal erythema.     Tonsils: No tonsillar exudate.  Cardiovascular:     Rate and Rhythm: Normal rate and regular rhythm.     Heart sounds: Normal heart sounds.  Pulmonary:  Breath sounds: Normal breath sounds and air entry. No decreased breath sounds, wheezing, rhonchi or rales.     Comments: occ dry cough Chest:     Chest wall: No tenderness.  Abdominal:     General: Abdomen is flat. Bowel sounds are normal.     Tenderness: There is no abdominal tenderness. There is no guarding or rebound.  Lymphadenopathy:     Cervical: No cervical adenopathy.  Neurological:     General: No focal deficit present.     Mental Status: She is alert and oriented to person, place, and time.  Psychiatric:        Attention and Perception: Attention and perception normal.        Mood and Affect: Mood and affect normal.        Behavior: Behavior normal. Behavior is cooperative.        Thought Content: Thought content normal.        Judgment: Judgment normal.      UC Treatments / Results  Labs (all labs ordered are listed, but only abnormal results are displayed) Labs Reviewed - No data to display  EKG   Radiology No results found.  Procedures Procedures (including critical care time)  Medications Ordered in UC Medications - No data to display  Initial Impression / Assessment and Plan / UC Course  I have reviewed the triage vital signs and the nursing notes.  Pertinent labs & imaging results that were available during my care of the patient were reviewed by me and considered in my medical decision making (see chart for details).      This patient is a 45 year old female presenting with recurrent bronchitis that I suspect is related to having COVID 1/22 and/or allergic asthma. Today this pt is afebrile nontachycardic nontachypneic, oxygenating well on room  air, no wheezes rhonchi or rales.    Prednisone sent as below, albuterol inhaler sent as well.  Continue Zyrtec for allergic rhinitis component.  Discussed that hydrocodone cough syrup and an antibiotic are not indicated, as she does not have an acute virus and she does not have a bacterial infection.  Establish care with primary care as scheduled 6/7.  ED return precautions discussed.    Final Clinical Impressions(s) / UC Diagnoses   Final diagnoses:  History of COVID-19  Acute bronchitis, unspecified organism     Discharge Instructions     -You have bronchitis again.  This is inflammation of the lungs.  It can happen from having a virus, but in your case I think it is more from inflammation from having COVID a few months ago.  We treat this with a steroid called prednisone, and an inhaler. -Prednisone, 2 pills taken at the same time for 5 days in a row.  Try taking this earlier in the day as it can give you energy.  -Albuterol inhaler as needed for cough, wheezing, shortness of breath, up to every 6 hours.  This will help open up your lungs so that you cough less and can breathe easier. -You do not have a bacterial infection or pneumonia today.  Signs of this are fevers, fast heart rate, shortness of breath, significant wheezing, dizziness, chest pain.  If you do develop these symptoms, come back and see [place]. -Establish care with your primary care as scheduled on 6/7.  You will likely require a referral to pulmonology to evaluate your respiratory symptoms.    ED Prescriptions    Medication Sig Dispense Auth. Provider   predniSONE (DELTASONE)  20 MG tablet Take 2 tablets (40 mg total) by mouth daily for 5 days. 10 tablet Rhys Martini, PA-C   albuterol (VENTOLIN HFA) 108 (90 Base) MCG/ACT inhaler Inhale 1-2 puffs into the lungs every 6 (six) hours as needed for wheezing or shortness of breath. 1 each Rhys Martini, PA-C     PDMP not reviewed this encounter.   Rhys Martini,  PA-C 03/24/21 1442

## 2021-03-24 NOTE — Discharge Instructions (Addendum)
-  You have bronchitis again.  This is inflammation of the lungs.  It can happen from having a virus, but in your case I think it is more from inflammation from having COVID a few months ago.  We treat this with a steroid called prednisone, and an inhaler. -Prednisone, 2 pills taken at the same time for 5 days in a row.  Try taking this earlier in the day as it can give you energy.  -Albuterol inhaler as needed for cough, wheezing, shortness of breath, up to every 6 hours.  This will help open up your lungs so that you cough less and can breathe easier. -You do not have a bacterial infection or pneumonia today.  Signs of this are fevers, fast heart rate, shortness of breath, significant wheezing, dizziness, chest pain.  If you do develop these symptoms, come back and see [place]. -Establish care with your primary care as scheduled on 6/7.  You will likely require a referral to pulmonology to evaluate your respiratory symptoms.

## 2021-03-24 NOTE — ED Triage Notes (Signed)
Pt presents with cough xs 10 days. States feels like she cant catch her breath when coughing.

## 2021-04-02 ENCOUNTER — Other Ambulatory Visit: Payer: Self-pay

## 2021-04-02 ENCOUNTER — Encounter (HOSPITAL_COMMUNITY): Payer: Self-pay

## 2021-04-02 ENCOUNTER — Emergency Department (HOSPITAL_COMMUNITY)
Admission: EM | Admit: 2021-04-02 | Discharge: 2021-04-03 | Disposition: A | Discharge status: Home / Self Care | Payer: 59 | Attending: Emergency Medicine | Admitting: Emergency Medicine

## 2021-04-02 DIAGNOSIS — R590 Localized enlarged lymph nodes: Secondary | ICD-10-CM | POA: Insufficient documentation

## 2021-04-02 DIAGNOSIS — L03211 Cellulitis of face: Secondary | ICD-10-CM | POA: Insufficient documentation

## 2021-04-02 DIAGNOSIS — B0221 Postherpetic geniculate ganglionitis: Secondary | ICD-10-CM | POA: Insufficient documentation

## 2021-04-02 DIAGNOSIS — R519 Headache, unspecified: Secondary | ICD-10-CM | POA: Diagnosis present

## 2021-04-02 LAB — BASIC METABOLIC PANEL
Anion gap: 9 (ref 5–15)
BUN: 14 mg/dL (ref 6–20)
CO2: 25 mmol/L (ref 22–32)
Calcium: 8.4 mg/dL — ABNORMAL LOW (ref 8.9–10.3)
Chloride: 101 mmol/L (ref 98–111)
Creatinine, Ser: 0.5 mg/dL (ref 0.44–1.00)
GFR, Estimated: >60 mL/min (ref >60)
Glucose, Bld: 105 mg/dL — ABNORMAL HIGH (ref 70–99)
Potassium: 3.6 mmol/L (ref 3.5–5.1)
Sodium: 135 mmol/L (ref 135–145)

## 2021-04-02 LAB — CBC WITH DIFFERENTIAL/PLATELET
Abs Immature Granulocytes: 0.04 10*3/uL (ref 0.00–0.07)
Basophils Absolute: 0 10*3/uL (ref 0.0–0.1)
Basophils Relative: 1 %
Eosinophils Absolute: 0.2 10*3/uL (ref 0.0–0.5)
Eosinophils Relative: 3 %
HCT: 38.9 % (ref 36.0–46.0)
Hemoglobin: 12.9 g/dL (ref 12.0–15.0)
Immature Granulocytes: 1 %
Lymphocytes Relative: 27 %
Lymphs Abs: 2 10*3/uL (ref 0.7–4.0)
MCH: 29.8 pg (ref 26.0–34.0)
MCHC: 33.2 g/dL (ref 30.0–36.0)
MCV: 89.8 fL (ref 80.0–100.0)
Monocytes Absolute: 0.8 10*3/uL (ref 0.1–1.0)
Monocytes Relative: 10 %
Neutro Abs: 4.4 10*3/uL (ref 1.7–7.7)
Neutrophils Relative %: 58 %
Platelets: 305 10*3/uL (ref 150–400)
RBC: 4.33 MIL/uL (ref 3.87–5.11)
RDW: 13.4 % (ref 11.5–15.5)
WBC: 7.4 10*3/uL (ref 4.0–10.5)
nRBC: 0 % (ref 0.0–0.2)

## 2021-04-02 NOTE — ED Provider Notes (Signed)
Emergency Medicine Provider Triage Evaluation Note  Jasmine Dominguez , a 45 y.o. female  was evaluated in triage.  Pt complains of left jaw pain associated with edema x5 days. Swelling has gradually worsened. Denies dental pain. No fever or chills. Admits to difficulties swallowing on the left side. Pain radiated from left side of jaw to ear. No trauma. Patient also states she developed a rash to her left shoulder today. No history of shingles vaccine.  Review of Systems  Positive: Jaw edema, rash Negative: fever  Physical Exam  BP (!) 163/102   Pulse 95   Temp 98 F (36.7 C) (Oral)   Resp 16   Ht 5' (1.524 m)   Wt 78.4 kg   SpO2 96%   BMI 33.76 kg/m  Gen:   Awake, no distress  Resp:  Normal effort  MSK:   Moves extremities without difficulty  Other:  Edema to left jaw. No dental abscess appreciate on exam. Erythematous rash with vesicles to left shoulder  Medical Decision Making  Medically screening exam initiated at 9:58 PM.  Appropriate orders placed.  MIRJANA TARLETON was informed that the remainder of the evaluation will be completed by another provider, this initial triage assessment does not replace that evaluation, and the importance of remaining in the ED until their evaluation is complete.  Left jaw edema with possible abscess. Airway patent. No signs of respiratory distress. Rash possible shingles.    Jesusita Oka 04/02/21 2202    Pricilla Loveless, MD 04/03/21 [phone]

## 2021-04-02 NOTE — ED Triage Notes (Signed)
Pain to left side of head and some swelling to left jaw area x 4-5days. Pt states having pain from left ear radiating to left eye,  Denies any recent trauma, fever and chills. Tried warm compress and OTC meds but no relief.

## 2021-04-03 ENCOUNTER — Emergency Department (HOSPITAL_COMMUNITY): Payer: 59

## 2021-04-03 ENCOUNTER — Ambulatory Visit (HOSPITAL_COMMUNITY): Payer: Self-pay

## 2021-04-03 MED ORDER — CEPHALEXIN 500 MG PO CAPS: 500.0000 mg | ORAL_CAPSULE | Freq: Four times a day (QID) | ORAL | 0 refills | Status: AC

## 2021-04-03 MED ORDER — ACYCLOVIR 400 MG PO TABS: 800.0000 mg | ORAL_TABLET | Freq: Every day | ORAL | 0 refills | Status: AC

## 2021-04-03 MED ORDER — ACYCLOVIR 400 MG PO TABS: 800.0000 mg | ORAL_TABLET | Freq: Every day | ORAL | 0 refills | Status: DC

## 2021-04-03 MED ORDER — CEPHALEXIN 500 MG PO CAPS: 500.0000 mg | ORAL_CAPSULE | Freq: Four times a day (QID) | ORAL | 0 refills | Status: DC

## 2021-04-03 NOTE — ED Provider Notes (Signed)
[facility] EMERGENCY DEPARTMENT Provider Note   CSN: [id number] Arrival date & time: 04/02/21  2140     History Chief Complaint  Patient presents with  . Headache  . swelling    Jasmine Dominguez is a 45 y.o. female.  HPI 45 year old female with no pertinent medical history presents emergency department for face pain, swelling, and rash.  States 5 days ago started having a headache like she had hit it on a cabinet, but denies any trauma.  This has been intermittent with waxing and waning intensity.  4 days ago, she started developing swelling under her left chin and a vesicular rash on her left neck under her left chin.  Has external ear pain as well.  Denies history of anything like this previously.  Nothing makes it better, nothing makes it worse.  Did try Advil without improvement.  Denies fevers, but does states she is getting over a URI.  Cough is currently mild.    Past Medical History:  Diagnosis Date  . NSVD (normal spontaneous vaginal delivery)     There are no problems to display for this patient.   Past Surgical History:  Procedure Laterality Date  . CESAREAN SECTION     X 3... severe preeclampsia 29 wks  . TUBAL LIGATION       OB History    Gravida  4   Para  4   Term      Preterm      AB      Living  4     SAB      IAB      Ectopic      Multiple      Live Births              Family History  Problem Relation Age of Onset  . Diabetes Mother   . Hypertension Mother   . Diabetes Father   . Hypertension Father   . Heart disease Sister   . Heart disease Brother     Social History   Tobacco Use  . Smoking status: Never Smoker  . Smokeless tobacco: Never Used  Substance Use Topics  . Alcohol use: No  . Drug use: No    Home Medications Prior to Admission medications   Medication Sig Start Date End Date Taking? Authorizing Provider  acyclovir (ZOVIRAX) 400 MG tablet Take 2 tablets (800 mg total) by mouth 5  (five) times daily for 7 days. 04/03/21 04/10/21  Louretta Parma, DO  albuterol (VENTOLIN HFA) 108 (90 Base) MCG/ACT inhaler Inhale 1-2 puffs into the lungs every 6 (six) hours as needed for wheezing or shortness of breath. 03/24/21   Rhys Martini, PA-C  amLODipine (NORVASC) 5 MG tablet Take 1 tablet by mouth daily. 12/07/20   [provider]  azithromycin (ZITHROMAX) 250 MG tablet See admin instructions. follow package directions 12/07/20   [provider]  cephALEXin (KEFLEX) 500 MG capsule Take 1 capsule (500 mg total) by mouth 4 (four) times daily for 7 days. 04/03/21 04/10/21  Louretta Parma, DO  cetirizine (ZYRTEC ALLERGY) 10 MG tablet Take 1 tablet (10 mg total) by mouth daily. 02/22/21   Particia Nearing, PA-C  hydrochlorothiazide (HYDRODIURIL) 25 MG tablet Take 25 mg by mouth daily.    [provider]  Multiple Vitamin (MULTIVITAMIN) tablet Take 1 tablet by mouth daily.    [provider]  nitrofurantoin, macrocrystal-monohydrate, (MACROBID) 100 MG capsule Take 1 capsule (100 mg total) by  mouth 2 (two) times daily. 01/01/14   Ok Edwards, MD  promethazine-dextromethorphan (PROMETHAZINE-DM) 6.25-15 MG/5ML syrup Take 5 mLs by mouth 4 (four) times daily as needed for cough. 02/22/21   Particia Nearing, PA-C    Allergies    Patient has no known allergies.  Review of Systems   Review of Systems  Constitutional: Negative for chills and fever.  HENT: Positive for ear pain and facial swelling. Negative for hearing loss, sore throat and trouble swallowing.   Eyes: Negative for pain and visual disturbance.  Respiratory: Negative for cough and shortness of breath.   Cardiovascular: Negative for chest pain and palpitations.  Gastrointestinal: Negative for abdominal pain and vomiting.  Genitourinary: Negative for dysuria and hematuria.  Musculoskeletal: Negative for arthralgias and back pain.  Skin: Negative for color change and rash.   Neurological: Positive for headaches. Negative for seizures and syncope.  All other systems reviewed and are negative.   Physical Exam Updated Vital Signs BP (!) 168/105   Pulse 85   Temp 98.1 F (36.7 C) (Oral)   Resp 18   Ht 5' (1.524 m)   Wt 78.4 kg   SpO2 98%   BMI 33.76 kg/m   Physical Exam Vitals and nursing note reviewed.  Constitutional:      General: She is not in acute distress.    Appearance: She is well-developed.  HENT:     Head: Normocephalic and atraumatic.     Comments: 2 cm vesicular rash to left neck, and 1 under left chin    Ears:     Comments: Small vesicle in left external auditory canal, tympanic membrane unremarkable    Mouth/Throat:     Mouth: Mucous membranes are moist.     Pharynx: Oropharynx is clear.  Eyes:     Extraocular Movements: Extraocular movements intact.     Conjunctiva/sclera: Conjunctivae normal.     Pupils: Pupils are equal, round, and reactive to light.  Cardiovascular:     Rate and Rhythm: Normal rate and regular rhythm.     Heart sounds: No murmur heard.   Pulmonary:     Effort: Pulmonary effort is normal. No respiratory distress.     Breath sounds: Normal breath sounds.  Abdominal:     Palpations: Abdomen is soft.     Tenderness: There is no abdominal tenderness.  Musculoskeletal:     Cervical back: Normal range of motion and neck supple.  Lymphadenopathy:     Cervical: Cervical adenopathy present.  Skin:    General: Skin is warm and dry.  Neurological:     Mental Status: She is alert and oriented to person, place, and time. Mental status is at baseline.  Psychiatric:        Mood and Affect: Mood normal.        Behavior: Behavior normal.     ED Results / Procedures / Treatments   Labs (all labs ordered are listed, but only abnormal results are displayed) Labs Reviewed  BASIC METABOLIC PANEL - Abnormal; Notable for the following components:      Result Value   Glucose, Bld 105 (*)    Calcium 8.4 (*)    All  other components within normal limits  CBC WITH DIFFERENTIAL/PLATELET  I-STAT BETA HCG BLOOD, ED ([facility], [facility], [facility] ONLY)    EKG None  Radiology DG Chest 2 View  Result Date: 04/03/2021 CLINICAL DATA:  Persistent cough High-resolution for TB Left jaw pain EXAM: CHEST - 2 VIEW COMPARISON:  None. FINDINGS:  Mild cardiomegaly.  No pulmonary vascular congestion.  Lungs clear. IMPRESSION: Mild cardiomegaly. Electronically Signed   By: Acquanetta Belling M.D.   On: 04/03/2021 08:14    Procedures Procedures   Medications Ordered in ED Medications - No data to display  ED Course  I have reviewed the triage vital signs and the nursing notes.  Pertinent labs & imaging results that were available during my care of the patient were reviewed by me and considered in my medical decision making (see chart for details).    MDM Rules/Calculators/A&P                          45 year old female with no pertinent medical history presents emergency department for evaluation of rash, swelling, and headache.  Vital signs are stable, she is not in acute distress.  Exam does show vesicular rash to left neck.  Does have what appears to be small vesicle in the left external auditory canal.  Additionally, has significant left-sided cervical adenopathy.  No posterior oropharyngeal swelling.  No facial droop.  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  Will obtain chest x-ray due to reported prolonged cough.  Does have some overlying erythema consistent with possible superimposed bacterial infection.  Less likely to be staphylococcal infection,  RPA, PTA, or Lemierre's syndrome.  Triage labs unremarkable.  Chest x-ray with no acute findings, not consistent with active TB.  Discussed findings with patient.  She feels comfortable going home.  Prescribed acyclovir for zoster and Keflex for overlying cellulitis.  Recommended very close PCP follow-up.  We discussed  symptomatic management and return precautions.  Patient discharged in stable condition.  Final Clinical Impression(s) / ED Diagnoses Final diagnoses:  Ramsay Hunt auricular syndrome  Cellulitis of face    Rx / DC Orders ED Discharge Orders         Ordered    cephALEXin (KEFLEX) 500 MG capsule  4 times daily,   Status:  Discontinued        04/03/21 0821    acyclovir (ZOVIRAX) 400 MG tablet  5 times daily,   Status:  Discontinued        04/03/21 0821    acyclovir (ZOVIRAX) 400 MG tablet  5 times daily        04/03/21 0823    cephALEXin (KEFLEX) 500 MG capsule  4 times daily        04/03/21 0823           Louretta Parma, DO 04/03/21 0160    Tegeler, Canary Brim, MD 04/04/21 [phone]

## 2021-04-03 NOTE — Discharge Instructions (Signed)
Follow-up closely with your normal doctor within 1 week. Start taking Keflex 4 times daily for 7 days.  This will help with the infection on your face. Start taking acyclovir 5 times daily days.  This will help with the shingles. You can alternate ibuprofen and Tylenol as needed for pain and swelling. You should not be around anyone who is pregnant while the rash continues to grow.

## 2021-04-18 ENCOUNTER — Other Ambulatory Visit: Payer: Self-pay

## 2021-04-18 ENCOUNTER — Encounter: Payer: Self-pay | Admitting: Internal Medicine

## 2021-04-18 ENCOUNTER — Ambulatory Visit (INDEPENDENT_AMBULATORY_CARE_PROVIDER_SITE_OTHER): Payer: 59

## 2021-04-18 ENCOUNTER — Other Ambulatory Visit: Payer: 59

## 2021-04-18 ENCOUNTER — Ambulatory Visit (INDEPENDENT_AMBULATORY_CARE_PROVIDER_SITE_OTHER): Payer: 59 | Admitting: Internal Medicine

## 2021-04-18 VITALS — BP 136/88 | HR 95 | Temp 98.3°F | Ht 60.24 in | Wt 178.6 lb

## 2021-04-18 DIAGNOSIS — R053 Chronic cough: Secondary | ICD-10-CM | POA: Diagnosis not present

## 2021-04-18 DIAGNOSIS — I10 Essential (primary) hypertension: Secondary | ICD-10-CM | POA: Diagnosis not present

## 2021-04-18 DIAGNOSIS — E669 Obesity, unspecified: Secondary | ICD-10-CM | POA: Diagnosis not present

## 2021-04-18 DIAGNOSIS — E66811 Obesity, class 1: Secondary | ICD-10-CM | POA: Insufficient documentation

## 2021-04-18 MED ORDER — PANTOPRAZOLE SODIUM 40 MG PO TBEC: 40.0000 mg | DELAYED_RELEASE_TABLET | Freq: Every day | ORAL | 1 refills | Status: DC

## 2021-04-18 NOTE — Progress Notes (Signed)
New Patient Office Visit     This visit occurred during the SARS-CoV-2 public health emergency.  Safety protocols were in place, including screening questions prior to the visit, additional usage of staff PPE, and extensive cleaning of exam room while observing appropriate contact time as indicated for disinfecting solutions.    CC/Reason for Visit: Establish care, discuss chronic conditions and acute concern Previous PCP: None Last Visit: Unknown  HPI: Jasmine Dominguez is a 45 y.o. female who is coming in today for the above mentioned reasons. Past Medical History is significant for: Hypertension and obesity.  She manages her hypertension with hydrochlorothiazide 25 mg and amlodipine 5 mg daily.  She has been having a chronic cough since December.  She did test positive for COVID in February however the cough predates her COVID diagnosis.  There are no new medications.  She has noticed some dyspepsia type symptoms with sour taste in her mouth and frequent belching.  She is taking daily cetirizine.  She is from [place] and visits there quite frequently.  She does not smoke, she does not drink, she has no known drug allergies, no past surgical history.  Her family history significant for a brother with coronary artery disease and a father with diabetes and prostate cancer.  She has had 3 COVID vaccines.  She is overdue for an annual physical.   Past Medical/Surgical History: Past Medical History:  Diagnosis Date  . Chronic headaches   . Hypertension   . NSVD (normal spontaneous vaginal delivery)     Past Surgical History:  Procedure Laterality Date  . CESAREAN SECTION     X 3... severe preeclampsia 29 wks  . TUBAL LIGATION      Social History:  reports that she has never smoked. She has never used smokeless tobacco. She reports that she does not drink alcohol and does not use drugs.  Allergies: No Known Allergies  Family History:  Family History  Problem Relation Age  of Onset  . Diabetes Mother   . Hypertension Mother   . Diabetes Father   . Hypertension Father   . Cancer Father        Prostatte cancer   . Early death Father   . Heart disease Sister   . Asthma Sister   . Diabetes Sister   . Heart disease Brother   . Diabetes Brother   . Early death Brother   . Hypertension Brother   . Stroke Brother      Current Outpatient Medications:  .  pantoprazole (PROTONIX) 40 MG tablet, Take 1 tablet (40 mg total) by mouth daily., Disp: 90 tablet, Rfl: 1 .  albuterol (VENTOLIN HFA) 108 (90 Base) MCG/ACT inhaler, Inhale 1-2 puffs into the lungs every 6 (six) hours as needed for wheezing or shortness of breath., Disp: 1 each, Rfl: 0 .  amLODipine (NORVASC) 5 MG tablet, Take 1 tablet by mouth daily., Disp: , Rfl:  .  cetirizine (ZYRTEC ALLERGY) 10 MG tablet, Take 1 tablet (10 mg total) by mouth daily., Disp: 30 tablet, Rfl: 2 .  hydrochlorothiazide (HYDRODIURIL) 25 MG tablet, Take 25 mg by mouth daily., Disp: , Rfl:  .  Multiple Vitamin (MULTIVITAMIN) tablet, Take 1 tablet by mouth daily., Disp: , Rfl:  .  nitrofurantoin, macrocrystal-monohydrate, (MACROBID) 100 MG capsule, Take 1 capsule (100 mg total) by mouth 2 (two) times daily., Disp: 14 capsule, Rfl: 0  Review of Systems:  Constitutional: Denies fever, chills, diaphoresis, appetite change and fatigue.  HEENT:  Denies photophobia, eye pain, redness, hearing loss, ear pain, congestion, sore throat, rhinorrhea, sneezing, mouth sores, trouble swallowing, neck pain, neck stiffness and tinnitus.   Respiratory: Denies SOB, DOE,  chest tightness,  and wheezing.   Cardiovascular: Denies chest pain, palpitations and leg swelling.  Gastrointestinal: Denies nausea, vomiting, abdominal pain, diarrhea, constipation, blood in stool and abdominal distention.  Genitourinary: Denies dysuria, urgency, frequency, hematuria, flank pain and difficulty urinating.  Endocrine: Denies: hot or cold intolerance, sweats, changes  in hair or nails, polyuria, polydipsia. Musculoskeletal: Denies myalgias, back pain, joint swelling, arthralgias and gait problem.  Skin: Denies pallor, rash and wound.  Neurological: Denies dizziness, seizures, syncope, weakness, light-headedness, numbness and headaches.  Hematological: Denies adenopathy. Easy bruising, personal or family bleeding history  Psychiatric/Behavioral: Denies suicidal ideation, mood changes, confusion, nervousness, sleep disturbance and agitation    Physical Exam: Vitals:   04/18/21 1315  BP: 136/88  Pulse: 95  Temp: 98.3 F (36.8 C)  TempSrc: Oral  SpO2: 96%  Weight: 178 lb 9.6 oz (81 kg)  Height: 5' 0.24" (1.53 m)   Body mass index is 34.61 kg/m.  Constitutional: NAD, calm, comfortable Eyes: PERRL, lids and conjunctivae normal ENMT: Mucous membranes are moist.  Respiratory: clear to auscultation bilaterally, no wheezing, no crackles. Normal respiratory effort. No accessory muscle use.  Cardiovascular: Regular rate and rhythm, no murmurs / rubs / gallops. No extremity edema.  Neurologic: grossly intact and non-focal  Psychiatric: Normal judgment and insight. Alert and oriented x 3. Normal mood.    Impression and Plan:  Chronic cough  -On going for about 6 months. Check CXR. -Given some dyspepsia symptoms (sour taste in mouth, frequent belching) suspect GERD might be at play and likely contributing to her chronic cough. Start protonix daily. -Another frequent etiology is PND but she is already on cetirizine daily without significant improvement.  Primary hypertension -Fair control, continue to monitor. -On HCTZ 25 and amlodipine 5 mg daily.  Obesity (BMI 30.0-34.9) -Discussed healthy lifestyle, including increased physical activity and better food choices to promote weight loss.     Patient Instructions  -Nice seeing you today!!  -Start protonix 40 mg daily.  -chest Xray today.  -Schedule follow up next available for your physical.  Please come in fasting that day.     Chaya Jan, MD [facility]

## 2021-04-18 NOTE — Patient Instructions (Signed)
-  Nice seeing you today!!  -Start protonix 40 mg daily.  -chest Xray today.  -Schedule follow up next available for your physical. Please come in fasting that day.

## 2021-04-21 ENCOUNTER — Other Ambulatory Visit: Payer: Self-pay | Admitting: *Deleted

## 2021-04-21 MED ORDER — AMLODIPINE BESYLATE 5 MG PO TABS: 1.0000 | ORAL_TABLET | Freq: Every day | ORAL | 1 refills | Status: DC

## 2021-04-21 MED ORDER — HYDROCHLOROTHIAZIDE 25 MG PO TABS: 25.0000 mg | ORAL_TABLET | Freq: Every day | ORAL | 1 refills | Status: DC

## 2021-04-25 ENCOUNTER — Telehealth: Payer: Self-pay | Admitting: Internal Medicine

## 2021-04-25 DIAGNOSIS — R053 Chronic cough: Secondary | ICD-10-CM

## 2021-04-25 NOTE — Telephone Encounter (Signed)
The patient was seen on 06/07 by Dr. Ardyth Harps and was told to call on Tuesday if her cough hasn't got any better.  Please advise

## 2021-04-26 NOTE — Telephone Encounter (Signed)
The patient seen Dr. Jack Quarto on 06/07 and the medication is not helping her cough.  Please advise

## 2021-04-27 NOTE — Telephone Encounter (Signed)
Spoke with the patient. She has agreed to the referral for pulmonology. Referral has been placed. Nothing further needed at this time.

## 2021-04-27 NOTE — Telephone Encounter (Signed)
Spoke with the patient. She stated she is taking the Protonix but it does not seem to be helping and that she is unable to sleep due to the cough. Pt does not want to wait any longer to see if Protonix works. Please advise

## 2021-04-27 NOTE — Addendum Note (Signed)
Addended by: Solon Augusta on: 04/27/2021 11:23 AM   Modules accepted: Orders

## 2021-05-25 ENCOUNTER — Institutional Professional Consult (permissible substitution): Payer: 59 | Admitting: Pulmonary Disease

## 2021-06-23 ENCOUNTER — Encounter: Payer: Self-pay | Admitting: Internal Medicine

## 2021-06-23 ENCOUNTER — Other Ambulatory Visit: Payer: Self-pay

## 2021-06-23 ENCOUNTER — Ambulatory Visit (INDEPENDENT_AMBULATORY_CARE_PROVIDER_SITE_OTHER): Payer: 59 | Admitting: Internal Medicine

## 2021-06-23 ENCOUNTER — Other Ambulatory Visit: Payer: Self-pay | Admitting: Internal Medicine

## 2021-06-23 VITALS — BP 120/80 | HR 86 | Temp 98.1°F | Ht 60.5 in | Wt 178.9 lb

## 2021-06-23 DIAGNOSIS — D649 Anemia, unspecified: Secondary | ICD-10-CM | POA: Insufficient documentation

## 2021-06-23 DIAGNOSIS — Z0000 Encounter for general adult medical examination without abnormal findings: Secondary | ICD-10-CM | POA: Diagnosis not present

## 2021-06-23 DIAGNOSIS — I10 Essential (primary) hypertension: Secondary | ICD-10-CM | POA: Diagnosis not present

## 2021-06-23 DIAGNOSIS — E669 Obesity, unspecified: Secondary | ICD-10-CM

## 2021-06-23 DIAGNOSIS — D509 Iron deficiency anemia, unspecified: Secondary | ICD-10-CM

## 2021-06-23 DIAGNOSIS — E559 Vitamin D deficiency, unspecified: Secondary | ICD-10-CM

## 2021-06-23 DIAGNOSIS — Z23 Encounter for immunization: Secondary | ICD-10-CM | POA: Diagnosis not present

## 2021-06-23 DIAGNOSIS — Z124 Encounter for screening for malignant neoplasm of cervix: Secondary | ICD-10-CM | POA: Diagnosis not present

## 2021-06-23 DIAGNOSIS — B370 Candidal stomatitis: Secondary | ICD-10-CM

## 2021-06-23 DIAGNOSIS — K219 Gastro-esophageal reflux disease without esophagitis: Secondary | ICD-10-CM

## 2021-06-23 LAB — COMPREHENSIVE METABOLIC PANEL
ALT: 18 U/L (ref 0–35)
AST: 13 U/L (ref 0–37)
Albumin: 3.9 g/dL (ref 3.5–5.2)
Alkaline Phosphatase: 80 U/L (ref 39–117)
BUN: 11 mg/dL (ref 6–23)
CO2: 28 mEq/L (ref 19–32)
Calcium: 8.8 mg/dL (ref 8.4–10.5)
Chloride: 100 mEq/L (ref 96–112)
Creatinine, Ser: 0.55 mg/dL (ref 0.40–1.20)
GFR: 110.13 mL/min (ref >60.00)
Glucose, Bld: 100 mg/dL — ABNORMAL HIGH (ref 70–99)
Potassium: 3.7 mEq/L (ref 3.5–5.1)
Sodium: 136 mEq/L (ref 135–145)
Total Bilirubin: 0.4 mg/dL (ref 0.2–1.2)
Total Protein: 7 g/dL (ref 6.0–8.3)

## 2021-06-23 LAB — LIPID PANEL
Cholesterol: 141 mg/dL (ref 0–200)
HDL: 43.6 mg/dL (ref >39.00)
LDL Cholesterol: 63 mg/dL (ref 0–99)
NonHDL: 97.07
Total CHOL/HDL Ratio: 3
Triglycerides: 169 mg/dL — ABNORMAL HIGH (ref 0.0–149.0)
VLDL: 33.8 mg/dL (ref 0.0–40.0)

## 2021-06-23 LAB — CBC WITH DIFFERENTIAL/PLATELET
Basophils Absolute: 0 10*3/uL (ref 0.0–0.1)
Basophils Relative: 0.4 % (ref 0.0–3.0)
Eosinophils Absolute: 0.1 10*3/uL (ref 0.0–0.7)
Eosinophils Relative: 1.4 % (ref 0.0–5.0)
HCT: 26.3 % — ABNORMAL LOW (ref 36.0–46.0)
Hemoglobin: 8.6 g/dL — ABNORMAL LOW (ref 12.0–15.0)
Lymphocytes Relative: 15 % (ref 12.0–46.0)
Lymphs Abs: 1.4 10*3/uL (ref 0.7–4.0)
MCHC: 32.7 g/dL (ref 30.0–36.0)
MCV: 79.6 fl (ref 78.0–100.0)
Monocytes Absolute: 0.6 10*3/uL (ref 0.1–1.0)
Monocytes Relative: 6.7 % (ref 3.0–12.0)
Neutro Abs: 7.3 10*3/uL (ref 1.4–7.7)
Neutrophils Relative %: 76.5 % (ref 43.0–77.0)
Platelets: 488 10*3/uL — ABNORMAL HIGH (ref 150.0–400.0)
RBC: 3.3 Mil/uL — ABNORMAL LOW (ref 3.87–5.11)
RDW: 16.8 % — ABNORMAL HIGH (ref 11.5–15.5)
WBC: 9.6 10*3/uL (ref 4.0–10.5)

## 2021-06-23 LAB — VITAMIN B12: Vitamin B-12: 1462 pg/mL — ABNORMAL HIGH (ref 211–911)

## 2021-06-23 LAB — HEMOGLOBIN A1C: Hgb A1c MFr Bld: 5.8 % (ref 4.6–6.5)

## 2021-06-23 LAB — TSH: TSH: 1.42 u[IU]/mL (ref 0.35–5.50)

## 2021-06-23 LAB — VITAMIN D 25 HYDROXY (VIT D DEFICIENCY, FRACTURES): VITD: 23.98 ng/mL — ABNORMAL LOW (ref 30.00–100.00)

## 2021-06-23 MED ORDER — NYSTATIN 100000 UNIT/ML MT SUSP
5.0000 mL | Freq: Four times a day (QID) | OROMUCOSAL | 0 refills | Status: DC
Start: 2021-06-23 — End: 2022-04-25

## 2021-06-23 MED ORDER — IRON (FERROUS SULFATE) 325 (65 FE) MG PO TABS: 325.0000 mg | ORAL_TABLET | Freq: Two times a day (BID) | ORAL | Status: AC

## 2021-06-23 MED ORDER — VITAMIN D (ERGOCALCIFEROL) 1.25 MG (50000 UNIT) PO CAPS: 50000.0000 [IU] | ORAL_CAPSULE | ORAL | 0 refills | Status: AC

## 2021-06-23 NOTE — Addendum Note (Signed)
Addended by: Kandra Nicolas on: 06/23/2021 08:05 AM   Modules accepted: Orders

## 2021-06-23 NOTE — Addendum Note (Signed)
Addended by: Kern Reap B on: 06/23/2021 09:15 AM   Modules accepted: Orders

## 2021-06-23 NOTE — Patient Instructions (Signed)
-  Nice seeing you today!!  -Lab work today; will notify you once results are available.  -tetanus vaccine today.  -Mammogram and colonoscopy will be requested.  -Schedule follow up in 6 months.

## 2021-06-23 NOTE — Progress Notes (Signed)
Established Patient Office Visit     This visit occurred during the SARS-CoV-2 public health emergency.  Safety protocols were in place, including screening questions prior to the visit, additional usage of staff PPE, and extensive cleaning of exam room while observing appropriate contact time as indicated for disinfecting solutions.    CC/Reason for Visit: Annual preventive exam  HPI: Jasmine Dominguez is a 44 y.o. female who is coming in today for the above mentioned reasons. Past Medical History is significant for: Hypertension, obesity, GERD.  She has been having a chronic cough, she tells me that she has been in [place] for the past few months where they have treated her with antibiotics, inhaled steroids, LABA, tiotropium.  Cough has significantly improved.  She has also been taking her Protonix that was prescribed at last visit.  She is overdue for mammogram and initial screening colonoscopy.  She states she had a Pap smear about 3 years ago, however declines Pap smear today.  She has had 3 COVID vaccines.  She is due for her Tdap booster.   Past Medical/Surgical History: Past Medical History:  Diagnosis Date   Chronic headaches    Hypertension    NSVD (normal spontaneous vaginal delivery)     Past Surgical History:  Procedure Laterality Date   CESAREAN SECTION     X 3... severe preeclampsia 29 wks   TUBAL LIGATION      Social History:  reports that she has never smoked. She has never used smokeless tobacco. She reports that she does not drink alcohol and does not use drugs.  Allergies: No Known Allergies  Family History:  Family History  Problem Relation Age of Onset   Diabetes Mother    Hypertension Mother    Diabetes Father    Hypertension Father    Cancer Father        Prostatte cancer    Early death Father    Heart disease Sister    Asthma Sister    Diabetes Sister    Heart disease Brother    Diabetes Brother    Early death Brother     Hypertension Brother    Stroke Brother      Current Outpatient Medications:    albuterol (VENTOLIN HFA) 108 (90 Base) MCG/ACT inhaler, Inhale 1-2 puffs into the lungs every 6 (six) hours as needed for wheezing or shortness of breath., Disp: 1 each, Rfl: 0   amLODipine (NORVASC) 5 MG tablet, Take 1 tablet (5 mg total) by mouth daily., Disp: 90 tablet, Rfl: 1   cetirizine (ZYRTEC ALLERGY) 10 MG tablet, Take 1 tablet (10 mg total) by mouth daily., Disp: 30 tablet, Rfl: 2   hydrochlorothiazide (HYDRODIURIL) 25 MG tablet, Take 1 tablet (25 mg total) by mouth daily., Disp: 90 tablet, Rfl: 1   Multiple Vitamin (MULTIVITAMIN) tablet, Take 1 tablet by mouth daily., Disp: , Rfl:    NON FORMULARY, Mercilon BCP, Disp: , Rfl:    NON FORMULARY, Lysteda, Disp: , Rfl:    nystatin (MYCOSTATIN) 100000 UNIT/ML suspension, Take 5 mLs (500,000 Units total) by mouth 4 (four) times daily., Disp: 60 mL, Rfl: 0   pantoprazole (PROTONIX) 40 MG tablet, Take 1 tablet (40 mg total) by mouth daily., Disp: 90 tablet, Rfl: 1  Review of Systems:  Constitutional: Denies fever, chills, diaphoresis, appetite change and fatigue.  HEENT: Denies photophobia, eye pain, redness, hearing loss, ear pain, congestion, sore throat, rhinorrhea, sneezing, mouth sores, trouble swallowing, neck pain, neck stiffness and  tinnitus.   Respiratory: Denies SOB, DOE, cough, chest tightness,  and wheezing.   Cardiovascular: Denies chest pain, palpitations and leg swelling.  Gastrointestinal: Denies nausea, vomiting, abdominal pain, diarrhea, constipation, blood in stool and abdominal distention.  Genitourinary: Denies dysuria, urgency, frequency, hematuria, flank pain and difficulty urinating.  Endocrine: Denies: hot or cold intolerance, sweats, changes in hair or nails, polyuria, polydipsia. Musculoskeletal: Denies myalgias, back pain, joint swelling, arthralgias and gait problem.  Skin: Denies pallor, rash and wound.  Neurological: Denies  dizziness, seizures, syncope, weakness, light-headedness, numbness and headaches.  Hematological: Denies adenopathy. Easy bruising, personal or family bleeding history  Psychiatric/Behavioral: Denies suicidal ideation, mood changes, confusion, nervousness, sleep disturbance and agitation    Physical Exam: Vitals:   06/23/21 0734  BP: 120/80  Pulse: 86  Temp: 98.1 F (36.7 C)  TempSrc: Oral  SpO2: 99%  Weight: 178 lb 14.4 oz (81.1 kg)  Height: 5' 0.5" (1.537 m)    Body mass index is 34.36 kg/m.   Constitutional: NAD, calm, comfortable Eyes: PERRL, lids and conjunctivae normal ENMT: Mucous membranes are moist.  White coating observed over tongue and soft palate.  Normal dentition. Tympanic membrane is pearly white, no erythema or bulging. Neck: normal, supple, no masses, no thyromegaly Respiratory: clear to auscultation bilaterally, no wheezing, no crackles. Normal respiratory effort. No accessory muscle use.  Cardiovascular: Regular rate and rhythm, no murmurs / rubs / gallops. No extremity edema. 2+ pedal pulses. No carotid bruits.  Abdomen: no tenderness, no masses palpated. No hepatosplenomegaly. Bowel sounds positive.  Musculoskeletal: no clubbing / cyanosis. No joint deformity upper and lower extremities. Good ROM, no contractures. Normal muscle tone.  Skin: no rashes, lesions, ulcers. No induration Neurologic: CN 2-12 grossly intact. Sensation intact, DTR normal. Strength 5/5 in all 4.  Psychiatric: Normal judgment and insight. Alert and oriented x 3. Normal mood.    Impression and Plan:  Encounter for preventive health examination -Advised routine eye and dental care. -Tdap today, otherwise immunizations are up-to-date. -Labs will be updated today. -Healthy lifestyle discussed in detail. -She declines in office Pap smear but agrees to GYN referral. -Refer to GI for initial screening colonoscopy. -Overdue for mammogram, I will request today.  Screening for cervical  cancer  - Plan: Ambulatory referral to Gynecology  Primary hypertension  -Well-controlled. -Continue hydrochlorothiazide and amlodipine.  Obesity (BMI 30.0-34.9) -Discussed healthy lifestyle, including increased physical activity and better food choices to promote weight loss.  Gastroesophageal reflux disease without esophagitis -Controlled on daily pantoprazole.  Oral thrush  - Plan: nystatin (MYCOSTATIN) 100000 UNIT/ML suspension -Likely from inhaled steroid, have advised that she needs to rinse her mouth afterward each administration.  Need for Tdap vaccination -Tdap booster administered today.    Patient Instructions  -Nice seeing you today!!  -Lab work today; will notify you once results are available.  -tetanus vaccine today.  -Mammogram and colonoscopy will be requested.  -Schedule follow up in 6 months.      Chaya Jan, MD [facility]

## 2021-06-29 ENCOUNTER — Encounter: Payer: Self-pay | Admitting: Internal Medicine

## 2021-07-06 ENCOUNTER — Institutional Professional Consult (permissible substitution): Payer: 59 | Admitting: Pulmonary Disease

## 2021-08-21 ENCOUNTER — Encounter: Payer: Self-pay | Admitting: Pulmonary Disease

## 2021-08-21 ENCOUNTER — Other Ambulatory Visit: Payer: Self-pay

## 2021-08-21 ENCOUNTER — Ambulatory Visit (INDEPENDENT_AMBULATORY_CARE_PROVIDER_SITE_OTHER): Payer: 59 | Admitting: Pulmonary Disease

## 2021-08-21 VITALS — BP 126/80 | HR 98 | Temp 98.0°F | Ht 59.0 in | Wt 178.6 lb

## 2021-08-21 DIAGNOSIS — J4520 Mild intermittent asthma, uncomplicated: Secondary | ICD-10-CM | POA: Diagnosis not present

## 2021-08-21 DIAGNOSIS — R058 Other specified cough: Secondary | ICD-10-CM | POA: Diagnosis not present

## 2021-08-21 DIAGNOSIS — Z8616 Personal history of COVID-19: Secondary | ICD-10-CM | POA: Diagnosis not present

## 2021-08-21 NOTE — Patient Instructions (Addendum)
Thank you for visiting Dr. Tonia Brooms at [facility]. Today we recommend the following:  Continue albuterol as needed.  Call us if your symptoms change  Return if symptoms worsen or fail to improve.    Please do your part to reduce the spread of COVID-19.

## 2021-08-21 NOTE — Progress Notes (Signed)
Synopsis: Referred in Oct 2022 for cough by Philip Aspen, [place]*  Subjective:   PATIENT ID: Jasmine Dominguez GENDER: female DOB: [birth year], MRN: [id number]  Chief Complaint  Patient presents with   Consult    This is a 45 year old female, history of hypertension.  Patient states that beginning of the year she had COVID.  She had a persistent cough that would not resolve for nearly 6 months.  She saw primary care and then she ultimately went to see a physician while she was in [place].  She was given inhalers.  She had a CT scan in [place] that she said showed inflammation of her lungs.  She was also treated with prednisone.  Now she is feeling much better her symptoms are now resolved.  She just wanted to establish care with a pulmonologist here in [place].   Past Medical History:  Diagnosis Date   Chronic headaches    Hypertension    NSVD (normal spontaneous vaginal delivery)      Family History  Problem Relation Age of Onset   Diabetes Mother    Hypertension Mother    Diabetes Father    Hypertension Father    Cancer Father        Prostatte cancer    Early death Father    Heart disease Sister    Asthma Sister    Diabetes Sister    Heart disease Brother    Diabetes Brother    Early death Brother    Hypertension Brother    Stroke Brother      Past Surgical History:  Procedure Laterality Date   CESAREAN SECTION     X 3... severe preeclampsia 29 wks   TUBAL LIGATION      Social History   Socioeconomic History   Marital status: Married    Spouse name: Not on file   Number of children: Not on file   Years of education: Not on file   Highest education level: Not on file  Occupational History   Not on file  Tobacco Use   Smoking status: Never   Smokeless tobacco: Never  Substance and Sexual Activity   Alcohol use: No   Drug use: No   Sexual activity: Yes    Birth control/protection: Surgical  Other Topics Concern   Not on file  Social History  Narrative   Not on file   Social Determinants of Health   Financial Resource Strain: Not on file  Food Insecurity: Not on file  Transportation Needs: Not on file  Physical Activity: Not on file  Stress: Not on file  Social Connections: Not on file  Intimate Partner Violence: Not on file     No Known Allergies   Outpatient Medications Prior to Visit  Medication Sig Dispense Refill   albuterol (VENTOLIN HFA) 108 (90 Base) MCG/ACT inhaler Inhale 1-2 puffs into the lungs every 6 (six) hours as needed for wheezing or shortness of breath. 1 each 0   amLODipine (NORVASC) 5 MG tablet Take 1 tablet (5 mg total) by mouth daily. 90 tablet 1   cetirizine (ZYRTEC ALLERGY) 10 MG tablet Take 1 tablet (10 mg total) by mouth daily. 30 tablet 2   hydrochlorothiazide (HYDRODIURIL) 25 MG tablet Take 1 tablet (25 mg total) by mouth daily. 90 tablet 1   Iron, Ferrous Sulfate, 325 (65 Fe) MG TABS Take 325 mg by mouth in the morning and at bedtime. 30 tablet    Multiple Vitamin (MULTIVITAMIN) tablet Take 1 tablet by  mouth daily.     NON FORMULARY Mercilon BCP     NON FORMULARY Lysteda     nystatin (MYCOSTATIN) 100000 UNIT/ML suspension Take 5 mLs (500,000 Units total) by mouth 4 (four) times daily. 60 mL 0   pantoprazole (PROTONIX) 40 MG tablet Take 1 tablet (40 mg total) by mouth daily. 90 tablet 1   Vitamin D, Ergocalciferol, (DRISDOL) 1.25 MG (50000 UNIT) CAPS capsule Take 1 capsule (50,000 Units total) by mouth every 7 (seven) days for 12 doses. 12 capsule 0   No facility-administered medications prior to visit.    Review of Systems  Constitutional:  Negative for chills, fever, malaise/fatigue and weight loss.  HENT:  Negative for hearing loss, sore throat and tinnitus.   Eyes:  Negative for blurred vision and double vision.  Respiratory:  Positive for cough. Negative for hemoptysis, sputum production, shortness of breath, wheezing and stridor.   Cardiovascular:  Negative for chest pain,  palpitations, orthopnea, leg swelling and PND.  Gastrointestinal:  Negative for abdominal pain, constipation, diarrhea, heartburn, nausea and vomiting.  Genitourinary:  Negative for dysuria, hematuria and urgency.  Musculoskeletal:  Negative for joint pain and myalgias.  Skin:  Negative for itching and rash.  Neurological:  Negative for dizziness, tingling, weakness and headaches.  Endo/Heme/Allergies:  Negative for environmental allergies. Does not bruise/bleed easily.  Psychiatric/Behavioral:  Negative for depression. The patient is not nervous/anxious and does not have insomnia.   All other systems reviewed and are negative.   Objective:  Physical Exam Vitals reviewed.  Constitutional:      General: She is not in acute distress.    Appearance: She is well-developed. She is obese.  HENT:     Head: Normocephalic and atraumatic.  Eyes:     General: No scleral icterus.    Conjunctiva/sclera: Conjunctivae normal.     Pupils: Pupils are equal, round, and reactive to light.  Neck:     Vascular: No JVD.     Trachea: No tracheal deviation.  Cardiovascular:     Rate and Rhythm: Normal rate and regular rhythm.     Heart sounds: Normal heart sounds. No murmur heard. Pulmonary:     Effort: Pulmonary effort is normal. No tachypnea, accessory muscle usage or respiratory distress.     Breath sounds: No stridor. No wheezing, rhonchi or rales.  Abdominal:     General: Bowel sounds are normal. There is no distension.     Palpations: Abdomen is soft.     Tenderness: There is no abdominal tenderness.  Musculoskeletal:        General: No tenderness.     Cervical back: Neck supple.  Lymphadenopathy:     Cervical: No cervical adenopathy.  Skin:    General: Skin is warm and dry.     Capillary Refill: Capillary refill takes less than 2 seconds.     Findings: No rash.  Neurological:     Mental Status: She is alert and oriented to person, place, and time.  Psychiatric:        Behavior:  Behavior normal.     Vitals:   08/21/21 1520  BP: 126/80  Pulse: 98  Temp: 98 F (36.7 C)  TempSrc: Oral  Weight: 178 lb 9.6 oz (81 kg)  Height: 4\' 11"  (1.499 m)     on RA BMI Readings from Last 3 Encounters:  08/21/21 36.07 kg/m  06/23/21 34.36 kg/m  04/18/21 34.61 kg/m   Wt Readings from Last 3 Encounters:  08/21/21 178 lb 9.6 oz (  81 kg)  06/23/21 178 lb 14.4 oz (81.1 kg)  04/18/21 178 lb 9.6 oz (81 kg)     CBC    Component Value Date/Time   WBC 9.6 06/23/2021 0805   RBC 3.30 (L) 06/23/2021 0805   HGB 8.6 Repeated and verified X2. (L) 06/23/2021 0805   HCT 26.3 Repeated and verified X2. (L) 06/23/2021 0805   PLT 488.0 (H) 06/23/2021 0805   MCV 79.6 06/23/2021 0805   MCH 29.8 04/02/2021 2159   MCHC 32.7 06/23/2021 0805   RDW 16.8 (H) 06/23/2021 0805   LYMPHSABS 1.4 06/23/2021 0805   MONOABS 0.6 06/23/2021 0805   EOSABS 0.1 06/23/2021 0805   BASOSABS 0.0 06/23/2021 0805    Chest Imaging: No recent chest imaging  Pulmonary Functions Testing Results: No flowsheet data found.  FeNO:   Pathology:   Echocardiogram:   Heart Catheterization:     Assessment & Plan:     ICD-10-CM   1. Other cough  R05.8     2. History of COVID-19  Z86.16     3. 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  She had this for about 6 months.  Ultimately was treated with steroids and inhaler.  Suspect she has mild intermittent reactive airway disease.  This is really common to see after a post viral syndrome.  From a symptom standpoint she feels better now.  Plan: At this time I would not add any additional medicines. She can continue the use of her albuterol inhaler as needed. If her symptoms recur throughout the year or if she has recurrent episodes of chest tightness wheezing or cough could consider treatment of  asthma with an inhaled steroid to see if this helps with her symptoms. She can follow-up up with [place] as needed.  Or see her primary care doctor if the symptoms recur. We appreciate consultation.   Current Outpatient Medications:    albuterol (VENTOLIN HFA) 108 (90 Base) MCG/ACT inhaler, Inhale 1-2 puffs into the lungs every 6 (six) hours as needed for wheezing or shortness of breath., Disp: 1 each, Rfl: 0   amLODipine (NORVASC) 5 MG tablet, Take 1 tablet (5 mg total) by mouth daily., Disp: 90 tablet, Rfl: 1   cetirizine (ZYRTEC ALLERGY) 10 MG tablet, Take 1 tablet (10 mg total) by mouth daily., Disp: 30 tablet, Rfl: 2   hydrochlorothiazide (HYDRODIURIL) 25 MG tablet, Take 1 tablet (25 mg total) by mouth daily., Disp: 90 tablet, Rfl: 1   Iron, Ferrous Sulfate, 325 (65 Fe) MG TABS, Take 325 mg by mouth in the morning and at bedtime., Disp: 30 tablet, Rfl:    Multiple Vitamin (MULTIVITAMIN) tablet, Take 1 tablet by mouth daily., Disp: , Rfl:    NON FORMULARY, Mercilon BCP, Disp: , Rfl:    NON FORMULARY, Lysteda, Disp: , Rfl:    nystatin (MYCOSTATIN) 100000 UNIT/ML suspension, Take 5 mLs (500,000 Units total) by mouth 4 (four) times daily., Disp: 60 mL, Rfl: 0   pantoprazole (PROTONIX) 40 MG tablet, Take 1 tablet (40 mg total) by mouth daily., Disp: 90 tablet, Rfl: 1   Vitamin D, Ergocalciferol, (DRISDOL) 1.25 MG (50000 UNIT) CAPS capsule, Take 1 capsule (50,000 Units total) by mouth every 7 (seven) days for 12 doses., Disp: 12 capsule, Rfl: 0   Josephine Igo, DO [facility] 08/21/2021 3:33 PM

## 2022-01-10 ENCOUNTER — Other Ambulatory Visit: Payer: Self-pay | Admitting: Internal Medicine

## 2022-04-06 NOTE — Progress Notes (Unsigned)
    ACUTE VISIT No chief complaint on file.  HPI: Ms.Jasmine Dominguez is a 46 y.o. female, who is here today complaining of *** HPI  Review of Systems Rest see pertinent positives and negatives per HPI.  Current Outpatient Medications on File Prior to Visit  Medication Sig Dispense Refill   albuterol (VENTOLIN HFA) 108 (90 Base) MCG/ACT inhaler Inhale 1-2 puffs into the lungs every 6 (six) hours as needed for wheezing or shortness of breath. 1 each 0   amLODipine (NORVASC) 5 MG tablet Take 1 tablet (5 mg total) by mouth daily. 90 tablet 1   cetirizine (ZYRTEC ALLERGY) 10 MG tablet Take 1 tablet (10 mg total) by mouth daily. 30 tablet 2   hydrochlorothiazide (HYDRODIURIL) 25 MG tablet TAKE 1 TABLET(25 MG) BY MOUTH DAILY 90 tablet 1   Iron, Ferrous Sulfate, 325 (65 Fe) MG TABS Take 325 mg by mouth in the morning and at bedtime. 30 tablet    Multiple Vitamin (MULTIVITAMIN) tablet Take 1 tablet by mouth daily.     NON FORMULARY Mercilon BCP     NON FORMULARY Lysteda     nystatin (MYCOSTATIN) 100000 UNIT/ML suspension Take 5 mLs (500,000 Units total) by mouth 4 (four) times daily. 60 mL 0   pantoprazole (PROTONIX) 40 MG tablet Take 1 tablet (40 mg total) by mouth daily. 90 tablet 1   No current facility-administered medications on file prior to visit.     Past Medical History:  Diagnosis Date   Chronic headaches    Hypertension    NSVD (normal spontaneous vaginal delivery)    No Known Allergies  Social History   Socioeconomic History   Marital status: Married    Spouse name: Not on file   Number of children: Not on file   Years of education: Not on file   Highest education level: Not on file  Occupational History   Not on file  Tobacco Use   Smoking status: Never   Smokeless tobacco: Never  Substance and Sexual Activity   Alcohol use: No   Drug use: No   Sexual activity: Yes    Birth control/protection: Surgical  Other Topics Concern   Not on file  Social  History Narrative   Not on file   Social Determinants of Health   Financial Resource Strain: Not on file  Food Insecurity: Not on file  Transportation Needs: Not on file  Physical Activity: Not on file  Stress: Not on file  Social Connections: Not on file    There were no vitals filed for this visit. There is no height or weight on file to calculate BMI.  Physical Exam  ASSESSMENT AND PLAN:  There are no diagnoses linked to this encounter.   No follow-ups on file.   Even Budlong G. Martinique, MD  [facility]. [place] office.  Discharge Instructions   None

## 2022-04-10 ENCOUNTER — Telehealth: Payer: Self-pay | Admitting: Family Medicine

## 2022-04-10 ENCOUNTER — Ambulatory Visit (INDEPENDENT_AMBULATORY_CARE_PROVIDER_SITE_OTHER): Payer: 59 | Admitting: Family Medicine

## 2022-04-10 ENCOUNTER — Encounter: Payer: Self-pay | Admitting: Family Medicine

## 2022-04-10 VITALS — BP 138/88 | HR 102 | Resp 16 | Ht 59.0 in | Wt 175.0 lb

## 2022-04-10 DIAGNOSIS — I10 Essential (primary) hypertension: Secondary | ICD-10-CM

## 2022-04-10 DIAGNOSIS — G44039 Episodic paroxysmal hemicrania, not intractable: Secondary | ICD-10-CM | POA: Diagnosis not present

## 2022-04-10 DIAGNOSIS — D500 Iron deficiency anemia secondary to blood loss (chronic): Secondary | ICD-10-CM | POA: Diagnosis not present

## 2022-04-10 DIAGNOSIS — R0989 Other specified symptoms and signs involving the circulatory and respiratory systems: Secondary | ICD-10-CM

## 2022-04-10 DIAGNOSIS — N938 Other specified abnormal uterine and vaginal bleeding: Secondary | ICD-10-CM

## 2022-04-10 LAB — CBC WITH DIFFERENTIAL/PLATELET
Basophils Absolute: 0 10*3/uL (ref 0.0–0.1)
Basophils Relative: 0.7 % (ref 0.0–3.0)
Eosinophils Absolute: 0.2 10*3/uL (ref 0.0–0.7)
Eosinophils Relative: 2.9 % (ref 0.0–5.0)
HCT: 33 % — ABNORMAL LOW (ref 36.0–46.0)
Hemoglobin: 11 g/dL — ABNORMAL LOW (ref 12.0–15.0)
Lymphocytes Relative: 32.1 % (ref 12.0–46.0)
Lymphs Abs: 1.9 10*3/uL (ref 0.7–4.0)
MCHC: 33.5 g/dL (ref 30.0–36.0)
MCV: 83.1 fl (ref 78.0–100.0)
Monocytes Absolute: 0.5 10*3/uL (ref 0.1–1.0)
Monocytes Relative: 7.9 % (ref 3.0–12.0)
Neutro Abs: 3.3 10*3/uL (ref 1.4–7.7)
Neutrophils Relative %: 56.4 % (ref 43.0–77.0)
Platelets: 280 10*3/uL (ref 150.0–400.0)
RBC: 3.97 Mil/uL (ref 3.87–5.11)
RDW: 16.3 % — ABNORMAL HIGH (ref 11.5–15.5)
WBC: 5.9 10*3/uL (ref 4.0–10.5)

## 2022-04-10 MED ORDER — NORETHINDRONE 0.35 MG PO TABS: 1.0000 | ORAL_TABLET | Freq: Every day | ORAL | 2 refills | Status: DC

## 2022-04-10 MED ORDER — AMLODIPINE BESYLATE 5 MG PO TABS: 5.0000 mg | ORAL_TABLET | Freq: Every day | ORAL | 0 refills | Status: DC

## 2022-04-10 MED ORDER — VALSARTAN 40 MG PO TABS: 40.0000 mg | ORAL_TABLET | Freq: Every day | ORAL | 1 refills | Status: DC

## 2022-04-10 NOTE — Patient Instructions (Addendum)
A few things to remember from today's visit:  Primary hypertension - Plan: valsartan (DIOVAN) 40 MG tablet, amLODipine (NORVASC) 5 MG tablet  Iron deficiency anemia due to chronic blood loss - Plan: CBC with Differential/Platelet  Depressed left ventricular ejection fraction - Plan: Ambulatory referral to Cardiology  DUB (dysfunctional uterine bleeding)  Nonintractable paroxysmal hemicrania, unspecified chronicity pattern - Plan: CT HEAD WO CONTRAST (5MM)  If you need refills please call your pharmacy. Do not use My Chart to request refills or for acute issues that need immediate attention.   Please be sure medication list is accurate. If a new problem present, please set up appointment sooner than planned today.  Hoy empezamos Valsartan otra vez, 40 mg. No cambios en el resto de los medicamentos para la presin arterial. Tmese la presin arterial diario. [facility] [employer].  Progesterona diario para controlar el sangrado vaginal.

## 2022-04-12 ENCOUNTER — Encounter: Payer: Self-pay | Admitting: Family Medicine

## 2022-04-13 ENCOUNTER — Other Ambulatory Visit: Payer: Self-pay | Admitting: Family Medicine

## 2022-04-13 DIAGNOSIS — N938 Other specified abnormal uterine and vaginal bleeding: Secondary | ICD-10-CM

## 2022-04-17 ENCOUNTER — Other Ambulatory Visit (INDEPENDENT_AMBULATORY_CARE_PROVIDER_SITE_OTHER): Payer: 59

## 2022-04-17 DIAGNOSIS — I10 Essential (primary) hypertension: Secondary | ICD-10-CM | POA: Diagnosis not present

## 2022-04-17 LAB — BASIC METABOLIC PANEL
BUN: 12 mg/dL (ref 6–23)
CO2: 29 mEq/L (ref 19–32)
Calcium: 9.2 mg/dL (ref 8.4–10.5)
Chloride: 98 mEq/L (ref 96–112)
Creatinine, Ser: 0.62 mg/dL (ref 0.40–1.20)
GFR: 106.38 mL/min (ref >60.00)
Glucose, Bld: 115 mg/dL — ABNORMAL HIGH (ref 70–99)
Potassium: 3.4 mEq/L — ABNORMAL LOW (ref 3.5–5.1)
Sodium: 137 mEq/L (ref 135–145)

## 2022-04-24 NOTE — Progress Notes (Signed)
Cardiology Office Note:    Date:  04/25/2022   ID:  Jasmine Dominguez, DOB [birth year], MRN [id number]  PCP:  Jasmine Dominguez, Jasmine PatriciaEstela Y, MD  Cardiologist:  Jasmine NoeHenry W Arnette Driggs III, MD   Referring MD: [place], Betty G, MD   Chief Complaint  Patient presents with   Advice Only    Prior history of mildly decreased EF  Hypertension  Heart murmur      History of Present Illness:    Jasmine Dominguez is a 46 Dominguez.o. female with a hx of primary hypertension, h/o heart murmur, and decreased EF (48%) in [place] 2022. Referred by Dr. Betty Swazil[place]Jordan for uncertain reasons - ? CHF.   She suffered COVID-19 in December 2021.  For 8 months after that she had a chronic cough and shortness of breath.  Saw cardiologist in [place] who did an evaluation that included an echocardiogram with EF of 48%.  She was treated with albuterol and other therapies and eventually the cough resolved.  She now feels fine.  She saw Dr. [place] who referred her because of the abnormality on the echocardiogram.  The patient currently denies orthopnea, PND, exertional intolerance, chest pain.  Her complaint now is palpitations that she is more aware of at night when she tries to sleep.  The palpitations occur on a regular basis.  No associated shortness of breath or incapacitation with the palpitations.  He does have a history of hypertension.  Has not been compliant with therapy but intends to do better.  There is a significant family history of heart disease (her father died at age 47 of MI; mother died early but causes not known; an older brother died of CHF at age 149; a sister has had valve replacement surgery)  Past Medical History:  Diagnosis Date   Chronic headaches    Hypertension    NSVD (normal spontaneous vaginal delivery)     Past Surgical History:  Procedure Laterality Date   CESAREAN SECTION     X 3... severe preeclampsia 29 wks   TUBAL LIGATION      Current Medications: Current Meds  Medication  Sig   amLODipine (NORVASC) 5 MG tablet Take 1 tablet (5 mg total) by mouth daily.   cetirizine (ZYRTEC ALLERGY) 10 MG tablet Take 1 tablet (10 mg total) by mouth daily.   hydrochlorothiazide (HYDRODIURIL) 25 MG tablet TAKE 1 TABLET(25 MG) BY MOUTH DAILY   Iron, Ferrous Sulfate, 325 (65 Fe) MG TABS Take 325 mg by mouth in the morning and at bedtime.   Multiple Vitamin (MULTIVITAMIN) tablet Take 1 tablet by mouth daily.   valsartan (DIOVAN) 40 MG tablet Take 1 tablet (40 mg total) by mouth daily.     Allergies:   Patient has no known allergies.   Social History   Socioeconomic History   Marital status: Married    Spouse name: Not on file   Number of children: Not on file   Years of education: Not on file   Highest education level: Not on file  Occupational History   Not on file  Tobacco Use   Smoking status: Never   Smokeless tobacco: Never  Substance and Sexual Activity   Alcohol use: No   Drug use: No   Sexual activity: Yes    Birth control/protection: Surgical  Other Topics Concern   Not on file  Social History Narrative   Not on file   Social Determinants of Health   Financial Resource Strain: Not on file  Food Insecurity: Not on file  Transportation Needs: Not on file  Physical Activity: Not on file  Stress: Not on file  Social Connections: Not on file     Family History: The patient's family history includes Asthma in her sister; Cancer in her father; Diabetes in her brother, father, mother, and sister; Early death in her brother and father; Heart disease in her brother and sister; Hypertension in her brother, father, and mother; Stroke in her brother.  ROS:   Please see the history of present illness.    Not currently working.  Cough is resolved.  Occasional lower extremity swelling.  All other systems reviewed and are negative.  EKGs/Labs/Other Studies Reviewed:    The following studies were reviewed today: No cardiac data.  EKG:  EKG normal sinus rhythm  at 78 bpm, incomplete right bundle branch block, near vertical axis, poor R wave progression V1 through V4 with precordial T wave abnormality.  Recent Labs: 06/23/2021: ALT 18; TSH 1.42 04/10/2022: Hemoglobin 11.0; Platelets 280.0 04/17/2022: BUN 12; Creatinine, Ser 0.62; Potassium 3.4; Sodium 137  Recent Lipid Panel    Component Value Date/Time   CHOL 141 06/23/2021 0805   TRIG 169.0 (H) 06/23/2021 0805   HDL 43.60 06/23/2021 0805   CHOLHDL 3 06/23/2021 0805   VLDL 33.8 06/23/2021 0805   LDLCALC 63 06/23/2021 0805    Physical Exam:    VS:  BP 110/86   Pulse 78   Ht  (1.499 m)   Wt 179 lb (81.2 kg)   LMP 03/20/2022   SpO2 96%   BMI 36.15 kg/m     Wt Readings from Last 3 Encounters:  04/25/22 179 lb (81.2 kg)  04/10/22 175 lb (79.4 kg)  08/21/21 178 lb 9.6 oz (81 kg)     GEN: Overweight. No acute distress HEENT: Normal NECK: No JVD. LYMPHATICS: No lymphadenopathy CARDIAC: No murmur. RRR no gallop, or edema. VASCULAR:  Normal Pulses. No bruits. RESPIRATORY:  Clear to auscultation without rales, wheezing or rhonchi  ABDOMEN: Soft, non-tender, non-distended, No pulsatile mass, MUSCULOSKELETAL: No deformity  SKIN: Warm and dry NEUROLOGIC:  Alert and oriented x 3 PSYCHIATRIC:  Normal affect   ASSESSMENT:    1. 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 Hypertension, unspecified type   3. Palpitations   4. SOB (shortness of breath)   5. Nonspecific abnormal electrocardiogram (ECG) (EKG)    PLAN:    In order of problems listed above:  This is an assumed diagnosis given the history of EF 48% in [place] 12 months ago.  She has no current symptoms.  We will repeat a 2D Doppler echocardiogram to determine LV size, function, and to rule out pulmonary hypertension. Improved on current medical regimen.  Encouraged her to continue to take the medication on a regular basis. 2-week monitor to rule out atrial fibrillation or other significant  arrhythmia. Resolved.  Echo may help further understanding.  I do not believe we need to do a BNP at this time. Incomplete right bundle, poor R wave progression, nonspecific ST-T wave abnormality.   Medication Adjustments/Labs and Tests Ordered: Current medicines are reviewed at length with the patient today.  Concerns regarding medicines are outlined above.  Orders Placed This Encounter  Procedures   LONG TERM MONITOR (3-14 DAYS)   EKG 12-Lead   ECHOCARDIOGRAM COMPLETE   No orders of the defined types were placed in this encounter.   Patient Instructions  Medication Instructions:  Your physician recommends  that you continue on your current medications as directed. Please refer to the Current Medication list given to you today.  *If you need a refill on your cardiac medications before your next appointment, please call your pharmacy*  Lab Work: NONE  Testing/Procedures: Your physician has requested that you have an echocardiogram. Echocardiography is a painless test that uses sound waves to create images of your heart. It provides your doctor with information about the size and shape of your heart and how well your heart's chambers and valves are working. This procedure takes approximately one hour. There are no restrictions for this procedure.  Your physician has recommended you wear a Zio Heart Monitor for 14 days.  Follow-Up: At [facility], you and your health needs are our priority.  As part of our continuing mission to provide you with exceptional heart care, we have created designated Provider Care Teams.  These Care Teams include your primary Cardiologist (physician) and Advanced Practice Providers (APPs -  Physician Assistants and Nurse Practitioners) who all work together to provide you with the care you need, when you need it.  Your next appointment:   As needed based on results of echocardiogram and heart monitor  The format for your next appointment:   In  Person  Provider:   Lesleigh Noe, MD {  Other Instructions Christena Deem- Long Term Monitor Instructions  Your physician has requested you wear a ZIO patch monitor for 14 days.  This is a single patch monitor. [employer] supplies one patch monitor per enrollment. Additional stickers are not available. Please do not apply patch if you will be having a Nuclear Stress Test,  Echocardiogram, Cardiac CT, MRI, or Chest Xray during the period you would be wearing the  monitor. The patch cannot be worn during these tests. You cannot remove and re-apply the  ZIO XT patch monitor.  Your ZIO patch monitor will be mailed 3 day USPS to your address on file. It may take 3-5 days  to receive your monitor after you have been enrolled.  Once you have received your monitor, please review the enclosed instructions. Your monitor  has already been registered assigning a specific monitor serial # to you.  Billing and Patient Assistance Program Information  We have supplied [employer] with any of your insurance information on file for billing purposes. [employer] offers a sliding scale Patient Assistance Program for patients that do not have  insurance, or whose insurance does not completely cover the cost of the ZIO monitor.  You must apply for the Patient Assistance Program to qualify for this discounted rate.  To apply, please call [employer] at [phone], select option 4, select option 2, ask to apply for  Patient Assistance Program. Meredeth Ide will ask your household income, and how many people  are in your household. They will quote your out-of-pocket cost based on that information.  [employer] will also be able to set up a 33-month, interest-free payment plan if needed.  Applying the monitor   Shave hair from upper left chest.  Hold abrader disc by orange tab. Rub abrader in 40 strokes over the upper left chest as  indicated in your monitor instructions.  Clean area with 4 enclosed alcohol pads. Let dry.  Apply  patch as indicated in monitor instructions. Patch will be placed under collarbone on left  side of chest with arrow pointing upward.  Rub patch adhesive wings for 2 minutes. Remove white label marked "1". Remove the white  label marked "2".  Rub patch adhesive wings for 2 additional minutes.  While looking in a mirror, press and release button in center of patch. A small green light will  flash 3-4 times. This will be your only indicator that the monitor has been turned on.  Do not shower for the first 24 hours. You may shower after the first 24 hours.  Press the button if you feel a symptom. You will hear a small click. Record Date, Time and  Symptom in the Patient Logbook.  When you are ready to remove the patch, follow instructions on the last 2 pages of Patient  Logbook. Stick patch monitor onto the last page of Patient Logbook.  Place Patient Logbook in the blue and white box. Use locking tab on box and tape box closed  securely. The blue and white box has prepaid postage on it. Please place it in the mailbox as  soon as possible. Your physician should have your test results approximately 7 days after the  monitor has been mailed back to [facility]  Call [facility] Customer Care at [phone] if you have questions regarding  your ZIO XT patch monitor. Call them immediately if you see an orange light blinking on your  monitor.  If your monitor falls off in less than 4 days, contact our Monitor department at [phone].  If your monitor becomes loose or falls off after 4 days call [employer] at [phone] for  suggestions on securing your monitor   Important Information About Sugar         Signed, Jasmine Noe, MD  04/25/2022 9:58 AM    [facility]

## 2022-04-25 ENCOUNTER — Encounter: Payer: Self-pay | Admitting: Interventional Cardiology

## 2022-04-25 ENCOUNTER — Ambulatory Visit: Payer: 59 | Admitting: Interventional Cardiology

## 2022-04-25 ENCOUNTER — Ambulatory Visit (INDEPENDENT_AMBULATORY_CARE_PROVIDER_SITE_OTHER): Payer: 59

## 2022-04-25 VITALS — BP 110/86 | HR 78 | Ht 59.0 in | Wt 179.0 lb

## 2022-04-25 DIAGNOSIS — R002 Palpitations: Secondary | ICD-10-CM

## 2022-04-25 DIAGNOSIS — I10 Essential (primary) hypertension: Secondary | ICD-10-CM | POA: Diagnosis not present

## 2022-04-25 DIAGNOSIS — I5042 Chronic combined systolic (congestive) and diastolic (congestive) heart failure: Secondary | ICD-10-CM | POA: Diagnosis not present

## 2022-04-25 DIAGNOSIS — R0602 Shortness of breath: Secondary | ICD-10-CM | POA: Diagnosis not present

## 2022-04-25 DIAGNOSIS — R9431 Abnormal electrocardiogram [ECG] [EKG]: Secondary | ICD-10-CM

## 2022-04-25 DIAGNOSIS — R011 Cardiac murmur, unspecified: Secondary | ICD-10-CM

## 2022-04-25 NOTE — Patient Instructions (Signed)
Medication Instructions:  Your physician recommends that you continue on your current medications as directed. Please refer to the Current Medication list given to you today.  *If you need a refill on your cardiac medications before your next appointment, please call your pharmacy*  Lab Work: NONE  Testing/Procedures: Your physician has requested that you have an echocardiogram. Echocardiography is a painless test that uses sound waves to create images of your heart. It provides your doctor with information about the size and shape of your heart and how well your heart's chambers and valves are working. This procedure takes approximately one hour. There are no restrictions for this procedure.  Your physician has recommended you wear a Zio Heart Monitor for 14 days.  Follow-Up: At [facility], you and your health needs are our priority.  As part of our continuing mission to provide you with exceptional heart care, we have created designated Provider Care Teams.  These Care Teams include your primary Cardiologist (physician) and Advanced Practice Providers (APPs -  Physician Assistants and Nurse Practitioners) who all work together to provide you with the care you need, when you need it.  Your next appointment:   As needed based on results of echocardiogram and heart monitor  The format for your next appointment:   In Person  Provider:   Lesleigh Noe, MD {  Other Instructions Christena Deem- Long Term Monitor Instructions  Your physician has requested you wear a ZIO patch monitor for 14 days.  This is a single patch monitor. [employer] supplies one patch monitor per enrollment. Additional stickers are not available. Please do not apply patch if you will be having a Nuclear Stress Test,  Echocardiogram, Cardiac CT, MRI, or Chest Xray during the period you would be wearing the  monitor. The patch cannot be worn during these tests. You cannot remove and re-apply the  ZIO XT patch monitor.   Your ZIO patch monitor will be mailed 3 day USPS to your address on file. It may take 3-5 days  to receive your monitor after you have been enrolled.  Once you have received your monitor, please review the enclosed instructions. Your monitor  has already been registered assigning a specific monitor serial # to you.  Billing and Patient Assistance Program Information  We have supplied [employer] with any of your insurance information on file for billing purposes. [employer] offers a sliding scale Patient Assistance Program for patients that do not have  insurance, or whose insurance does not completely cover the cost of the ZIO monitor.  You must apply for the Patient Assistance Program to qualify for this discounted rate.  To apply, please call [employer] at [phone], select option 4, select option 2, ask to apply for  Patient Assistance Program. Meredeth Ide will ask your household income, and how many people  are in your household. They will quote your out-of-pocket cost based on that information.  [employer] will also be able to set up a 71-month, interest-free payment plan if needed.  Applying the monitor   Shave hair from upper left chest.  Hold abrader disc by orange tab. Rub abrader in 40 strokes over the upper left chest as  indicated in your monitor instructions.  Clean area with 4 enclosed alcohol pads. Let dry.  Apply patch as indicated in monitor instructions. Patch will be placed under collarbone on left  side of chest with arrow pointing upward.  Rub patch adhesive wings for 2 minutes. Remove white label marked "1". Remove the  white  label marked "2". Rub patch adhesive wings for 2 additional minutes.  While looking in a mirror, press and release button in center of patch. A small green light will  flash 3-4 times. This will be your only indicator that the monitor has been turned on.  Do not shower for the first 24 hours. You may shower after the first 24 hours.  Press the button if  you feel a symptom. You will hear a small click. Record Date, Time and  Symptom in the Patient Logbook.  When you are ready to remove the patch, follow instructions on the last 2 pages of Patient  Logbook. Stick patch monitor onto the last page of Patient Logbook.  Place Patient Logbook in the blue and white box. Use locking tab on box and tape box closed  securely. The blue and white box has prepaid postage on it. Please place it in the mailbox as  soon as possible. Your physician should have your test results approximately 7 days after the  monitor has been mailed back to [facility].  Call [facility] Customer Care at [phone] if you have questions regarding  your ZIO XT patch monitor. Call them immediately if you see an orange light blinking on your  monitor.  If your monitor falls off in less than 4 days, contact our Monitor department at [phone].  If your monitor becomes loose or falls off after 4 days call [employer] at [phone] for  suggestions on securing your monitor   Important Information About Sugar

## 2022-04-25 NOTE — Progress Notes (Unsigned)
Applied a 14 day Zio XT monitor to patient in the office ?

## 2022-04-27 ENCOUNTER — Ambulatory Visit: Payer: 59 | Admitting: Radiology

## 2022-04-27 ENCOUNTER — Encounter: Payer: Self-pay | Admitting: Radiology

## 2022-04-27 VITALS — BP 130/84 | Ht 60.0 in | Wt 180.0 lb

## 2022-04-27 DIAGNOSIS — N921 Excessive and frequent menstruation with irregular cycle: Secondary | ICD-10-CM | POA: Diagnosis not present

## 2022-04-27 NOTE — Progress Notes (Signed)
   Jasmine Dominguez July 11, 1976 [id number]   History:  46 y.o. G4P4 presents with c/o heavy irregular menses. LMP 03/20/22, bled for 23 days before it stopped, very heavy. 3 previous cycles were normal but heavy. Cycle before that last 14days. Had some cramping, not severe. No other gyn concerns. She had a previous episode of heavy menses last year and was treated while in [place], unsure what treatment she was given.   Gynecologic History Patient's last menstrual period was 03/20/2022 (approximate). Period Cycle (Days): 28 Period Duration (Days): 5 Period Pattern: Regular Menstrual Flow: Heavy Dysmenorrhea: None Contraception/Family planning: tubal ligation Sexually active: yes   Obstetric History OB History  Gravida Para Term Preterm AB Living  4 4       4   SAB IAB Ectopic Multiple Live Births               # Outcome Date GA Lbr Len/2nd Weight Sex Delivery Anes PTL Lv  4 Para           3 Para           2 Para           1 Para              The following portions of the patient's history were reviewed and updated as appropriate: allergies, past family history, past medical history, past social history, past surgical history, and problem list.  Review of Systems Pertinent items noted in HPI and remainder of comprehensive ROS otherwise negative.   Past medical history, past surgical history, family history and social history were all reviewed and documented in the EPIC chart.   Exam:  Vitals:   04/27/22 0954  BP: 130/84  Weight: 180 lb (81.6 kg)  Height: 5' (1.524 m)   Body mass index is 35.15 kg/m.  General appearance:  Normal, obese Respiratory  Auscultation:  Clear without wheezing or rhonchi Cardiovascular  Auscultation:  Regular rate, without rubs, murmurs or gallops  Edema/varicosities:  Not grossly evident Abdominal  Soft,nontender, without masses, guarding or rebound.  Liver/spleen:  No organomegaly noted  Hernia:  None appreciated   Skin  Inspection:  Grossly normal Genitourinary   Inguinal/mons:  Normal without inguinal adenopathy  External genitalia:  Normal appearing vulva with no masses, tenderness, or lesions  BUS/Urethra/Skene's glands:  Normal without masses or exudate  Vagina:  Normal appearing with normal color and discharge, no lesions  Cervix:  Normal appearing without discharge or lesions  Uterus:  Normal in size, shape and contour.  Mobile, nontender  Adnexa/parametria:     Rt: Normal in size, without masses or tenderness.   Lt: Normal in size, without masses or tenderness.  Anus and perineum: Normal   Chaperone offered and declined  Assessment/Plan:   1. Menometrorrhagia  - 04/29/22 PELVIC COMPLETE WITH TRANSVAGINAL; Future  U/S at [place] imaging then visit with me to discuss results the same week   [place] B WHNP-BC 10:54 AM 04/27/2022

## 2022-05-02 ENCOUNTER — Ambulatory Visit
Admission: RE | Admit: 2022-05-02 | Discharge: 2022-05-02 | Disposition: A | Discharge status: Home / Self Care | Payer: No Typology Code available for payment source | Source: Ambulatory Visit | Attending: Radiology | Admitting: Radiology

## 2022-05-02 DIAGNOSIS — N921 Excessive and frequent menstruation with irregular cycle: Secondary | ICD-10-CM

## 2022-05-03 ENCOUNTER — Ambulatory Visit: Payer: 59 | Admitting: Radiology

## 2022-05-09 ENCOUNTER — Ambulatory Visit (INDEPENDENT_AMBULATORY_CARE_PROVIDER_SITE_OTHER): Payer: 59 | Admitting: Radiology

## 2022-05-09 VITALS — BP 140/80

## 2022-05-09 DIAGNOSIS — N924 Excessive bleeding in the premenopausal period: Secondary | ICD-10-CM

## 2022-05-09 NOTE — Progress Notes (Signed)
   Jasmine Dominguez Nov 07, 1976 [id number]  History:  46 y.o. G4P4 presents to discuss ultrasound results. She originally presented with c/o heavy irregular menses. LMP 03/20/22, bled for 23 days before it stopped, very heavy. 3 previous cycles were normal but heavy. Cycle before that last 14days. She had a previous episode of heavy menses last year and was treated while in [place], unsure what treatment she was given.    Gynecologic History Patient's last menstrual period was 03/20/2022 (approximate). Period Cycle (Days): 28 Period Duration (Days): 5 Period Pattern: Regular Menstrual Flow: Heavy Dysmenorrhea: None Contraception/Family planning: tubal ligation Sexually active: yes    Obstetric History OB History  Gravida Para Term Preterm AB Living  4 4       4   SAB IAB Ectopic Multiple Live Births               # Outcome Date GA Lbr Len/2nd Weight Sex Delivery Anes PTL Lv  4 Para           3 Para           2 Para           1 Para              The following portions of the patient's history were reviewed and updated as appropriate: allergies, current medications, past family history, past medical history, past social history, past surgical history, and problem list.  Review of Systems Pertinent items are noted in HPI.   Past medical history, past surgical history, family history and social history were all reviewed and documented in the EPIC chart.  CLINICAL DATA:  Menorrhagia, irregular menses for 3 months, unknown LMP, history Caesarean section   EXAM: TRANSABDOMINAL AND TRANSVAGINAL ULTRASOUND OF PELVIS   TECHNIQUE: Both transabdominal and transvaginal ultrasound examinations of the pelvis were performed. Transabdominal technique was performed for global imaging of the pelvis including uterus, ovaries, adnexal regions, and pelvic cul-de-sac. It was necessary to proceed with endovaginal exam following the transabdominal exam to visualize the uterus, endometrium,  and ovaries.   COMPARISON:  None   FINDINGS: Uterus   Measurements: 10.8 x 4.8 x 5.2 cm = volume: 167 ML. Anteverted. Heterogeneous myometrium with ill-defined endometrial complex, cannot exclude adenomyosis. No discrete mass.   Endometrium   Thickness: 8 mm.  Poorly marginated.  No discrete mass or fluid.   Right ovary   Measurements: 2.1 x 1.8 x 2.0 cm = volume: 3.7 mL. Normal morphology without mass   Left ovary   Measurements: 2.6 x 0.9 x 2.4 cm = volume: 3.0 mL. Normal morphology without mass   Other findings   No free pelvic fluid.  No adnexal masses.   IMPRESSION: Heterogeneous myometrium with ill-defined endometrial complex, cannot exclude adenomyosis.   Remainder of exam unremarkable.     Electronically Signed   By: M.D.   On: 05/02/2022 16:28   Assessment/Plan:   1. Excessive bleeding in premenopausal period Discussed result and options for treatment including POPs, IUD or ablation in detail. Pt elects for Mirena insertion. Will be leaving for vacation next week and would like to wait untul she returns for insertion  - IUD Insertion; Future     05/04/2022 B WHNP-BC 8:40 AM 05/09/2022

## 2022-05-21 ENCOUNTER — Ambulatory Visit (HOSPITAL_COMMUNITY): Payer: 59 | Attending: Cardiology

## 2022-05-21 DIAGNOSIS — R0602 Shortness of breath: Secondary | ICD-10-CM | POA: Insufficient documentation

## 2022-05-21 DIAGNOSIS — I5042 Chronic combined systolic (congestive) and diastolic (congestive) heart failure: Secondary | ICD-10-CM | POA: Diagnosis not present

## 2022-05-21 LAB — ECHOCARDIOGRAM COMPLETE
Area-P 1/2: 3.89 cm2
S' Lateral: 3.4 cm

## 2022-05-23 NOTE — Telephone Encounter (Signed)
Error. Please disregard

## 2022-06-16 ENCOUNTER — Other Ambulatory Visit: Payer: Self-pay | Admitting: Family Medicine

## 2022-06-16 DIAGNOSIS — I10 Essential (primary) hypertension: Secondary | ICD-10-CM

## 2022-07-07 ENCOUNTER — Other Ambulatory Visit: Payer: Self-pay | Admitting: Family Medicine

## 2022-07-07 DIAGNOSIS — I10 Essential (primary) hypertension: Secondary | ICD-10-CM

## 2022-10-16 ENCOUNTER — Telehealth: Payer: Self-pay

## 2022-10-16 ENCOUNTER — Telehealth (INDEPENDENT_AMBULATORY_CARE_PROVIDER_SITE_OTHER): Payer: Self-pay | Admitting: Family Medicine

## 2022-10-16 VITALS — Ht 60.0 in | Wt 180.0 lb

## 2022-10-16 DIAGNOSIS — R0981 Nasal congestion: Secondary | ICD-10-CM

## 2022-10-16 DIAGNOSIS — R051 Acute cough: Secondary | ICD-10-CM

## 2022-10-16 DIAGNOSIS — I10 Essential (primary) hypertension: Secondary | ICD-10-CM

## 2022-10-16 MED ORDER — BENZONATATE 100 MG PO CAPS: 100.0000 mg | ORAL_CAPSULE | Freq: Three times a day (TID) | ORAL | 0 refills | Status: DC | PRN

## 2022-10-16 MED ORDER — AMLODIPINE BESYLATE 5 MG PO TABS: ORAL_TABLET | ORAL | 0 refills | Status: DC

## 2022-10-16 MED ORDER — HYDROCHLOROTHIAZIDE 25 MG PO TABS: ORAL_TABLET | ORAL | 0 refills | Status: DC

## 2022-10-16 NOTE — Patient Instructions (Addendum)
  HOME CARE TIPS:  -COVID19 testing information: [email or web address]  Most pharmacies also offer testing and home test kits. If the Covid19 test is positive and you desire antiviral treatment, please contact a [facility] pharmacy or schedule a follow up virtual visit through your primary care office or through the [employer].  Other test to treat options: http://www.[email or web address]-[email or web address].[email or web address]_[email or web address]  -I sent the medication(s) we discussed to your pharmacy: Meds ordered this encounter  Medications   benzonatate (TESSALON PERLES) 100 MG capsule    Sig: Take 1 capsule (100 mg total) by mouth 3 (three) times daily as needed.    Dispense:  30 capsule    Refill:  0   amLODipine (NORVASC) 5 MG tablet    Sig: TAKE 1 TABLET(5 MG) BY MOUTH DAILY    Dispense:  90 tablet    Refill:  0   hydrochlorothiazide (HYDRODIURIL) 25 MG tablet    Sig: TAKE 1 TABLET(25 MG) BY MOUTH DAILY    Dispense:  90 tablet    Refill:  0     -can use tylenol f needed for fevers, aches and pains per instructions  -nasal saline sinus rinses twice daily  -stay hydrated, drink plenty of fluids and eat small healthy meals - avoid dairy  -can take 1000 IU ( ) Vit D3 and 100-500 mg of Vit C daily per instructions  -follow up with your doctor in 2-3 days unless improving and feeling better  -stay home while sick, except to seek medical care. If you have COVID19, you will likely be contagious for 7-10 days. Flu or Influenza is likely contagious for about 7 days. Other respiratory viral infections remain contagious for 5-10+ days depending on the virus and many other factors. Wear a good mask that fits snugly (such as N95 or KN95) if around others to reduce the risk of transmission.  It was nice to meet you today, and I really hope you are feeling better soon. I help [facility] out with telemedicine visits on Tuesdays and Thursdays and am happy to help if you need a follow up  virtual visit on those days. Otherwise, if you have any concerns or questions following this visit please schedule a follow up visit with your Primary Care doctor or seek care at a local urgent care clinic to avoid delays in care.    Seek in person care or schedule a follow up video visit promptly if your symptoms worsen, new concerns arise or you are not improving with treatment. Call 911 and/or seek emergency care if your symptoms are severe or life threatening.

## 2022-10-16 NOTE — Progress Notes (Signed)
Virtual Visit via Video Note  I connected with Jasmine Dominguez  on 10/16/22 at  3:40 PM EST by a video enabled telemedicine application and verified that I am speaking with the correct person using two identifiers.  Location patient: [place] Location provider:work or home office Persons participating in the virtual visit: patient, provider  I discussed the limitations and requested verbal permission for telemedicine visit. The patient expressed understanding and agreed to proceed.   HPI:  Acute telemedicine visit for cough and congestion: -Onset: 3 days ago -Symptoms include:nasal congestion, cough, sore from coughing, possible fever the first night, sore throat, mild headache -Denies: NVD, CP (except when cough), wheezing/SOB, hemoptysis -Has tried:musinex, sudafed -Pertinent past medical history: see below -Pertinent medication allergies:No Known Allergies -COVID-19 vaccine status:  Immunization History  Administered Date(s) Administered   PFIZER(Purple Top)SARS-COV-2 Vaccination 01/23/2020, 02/13/2020, 09/10/2020   Pneumococcal-Unspecified 05/23/2021   Tdap 11/12/2008, 06/23/2021   Other issues, HTN: -reports stable, about to run out of medications, reviewed last visit with cardiology this summer and BP ok at the time -denies swelling, CP/SOB, issues with her medications -requests refills of amlodipine and hctz  ROS: See pertinent positives and negatives per HPI.  Past Medical History:  Diagnosis Date   Chronic headaches    Hypertension    NSVD (normal spontaneous vaginal delivery)     Past Surgical History:  Procedure Laterality Date   CESAREAN SECTION     X 3... severe preeclampsia 29 wks   TUBAL LIGATION       Current Outpatient Medications:    amLODipine (NORVASC) 5 MG tablet, TAKE 1 TABLET(5 MG) BY MOUTH DAILY, Disp: 90 tablet, Rfl: 0   cetirizine (ZYRTEC ALLERGY) 10 MG tablet, Take 1 tablet (10 mg total) by mouth daily., Disp: 30 tablet, Rfl: 2   hydrochlorothiazide  (HYDRODIURIL) 25 MG tablet, TAKE 1 TABLET(25 MG) BY MOUTH DAILY, Disp: 90 tablet, Rfl: 1   Iron, Ferrous Sulfate, 325 (65 Fe) MG TABS, Take 325 mg by mouth in the morning and at bedtime., Disp: 30 tablet, Rfl:    Multiple Vitamin (MULTIVITAMIN) tablet, Take 1 tablet by mouth daily., Disp: , Rfl:    valsartan (DIOVAN) 40 MG tablet, TAKE 1 TABLET(40 MG) BY MOUTH DAILY, Disp: 30 tablet, Rfl: 1  EXAM:  VITALS per patient if applicable:denies fever  GENERAL: alert, oriented, appears well and in no acute distress  HEENT: atraumatic, conjunttiva clear, no obvious abnormalities on inspection of external nose and ears  NECK: normal movements of the head and neck  LUNGS: on inspection no signs of respiratory distress, breathing rate appears normal, no obvious gross SOB, gasping or wheezing, coughs a few times during visit  CV: no obvious cyanosis  MS: moves all visible extremities without noticeable abnormality  PSYCH/NEURO: pleasant and cooperative, no obvious depression or anxiety, speech and thought processing grossly intact  ASSESSMENT AND PLAN:  Discussed the following assessment and plan:  Acute cough  Primary hypertension  Nasal congestion  -we discussed possible serious and likely etiologies, options for evaluation and workup, limitations of telemedicine visit vs in person visit, treatment, treatment risks and precautions. Pt is agreeable to treatment via telemedicine at this moment. Query VURI, covid vs other. She has home covid tests and agrees to test today and tomorrow and knows can do follow up vv or contact [facility] if positive for antivaral. For now sent Tessalon for cough, discussed nasal saline rinse, OTC options. BP stable at last cardiology appt this summer. Denies any symptoms. Refilled amlodipine and  hctz per hre request.  Work/School slipped offered: declined Advised to seek prompt virtual visit or in person care if worsening, new symptoms arise, or if is not  improving with treatment as expected per our conversation of expected course. Discussed options for follow up care. Did let this patient know that I do telemedicine on Tuesdays and Thursdays for [facility] and those are the days I am logged into the system. Advised to schedule follow up visit with PCP, [place] virtual visits or [facility] if any further questions or concerns to avoid delays in care.   I discussed the assessment and treatment plan with the patient. The patient was provided an opportunity to ask questions and all were answered. The patient agreed with the plan and demonstrated an understanding of the instructions.     Terressa Koyanagi, DO

## 2022-10-17 ENCOUNTER — Encounter: Payer: Self-pay | Admitting: Family Medicine

## 2022-10-17 ENCOUNTER — Other Ambulatory Visit (HOSPITAL_COMMUNITY): Payer: Self-pay

## 2022-10-17 NOTE — Telephone Encounter (Signed)
Pt had a virtual with dr Selena Batten and was told to take covid test and she is positive please advise

## 2022-10-17 NOTE — Telephone Encounter (Signed)
Pt called to ask where should she call to get her medication?  CMA called [facility]  [phone] and they have Paxlovid.  Please advise, as Pt is wanting to go pick up Rx as soon as possible.

## 2022-10-17 NOTE — Telephone Encounter (Signed)
Pt seen dr Selena Batten for virtual and is calling back to let dr kim know she tested positive for covid

## 2022-10-18 MED ORDER — NIRMATRELVIR/RITONAVIR (PAXLOVID)TABLET: 3.0000 | ORAL_TABLET | Freq: Two times a day (BID) | ORAL | 0 refills | Status: AC

## 2022-10-18 NOTE — Telephone Encounter (Signed)
Pt called to FU and ask about medication Pt called pharmacy, and there is nothing. Is this the correct pharmacy: [facility] ??   Please advise.

## 2022-10-18 NOTE — Telephone Encounter (Signed)
Called patient and sent rx to [facility] instead per her request. Not sure why [facility] pharm[facility] would not rx as they have been able to in the past. I do not work for [facility] on Wednesdays so did not have acces to my phone/computer to rx at the time. Pt aware. Doing ok. Told her to seek inperson care if worsening or any serious symptoms. Thanks.

## 2023-01-11 ENCOUNTER — Other Ambulatory Visit: Payer: Self-pay | Admitting: Family Medicine

## 2023-01-11 DIAGNOSIS — I10 Essential (primary) hypertension: Secondary | ICD-10-CM

## 2023-05-15 ENCOUNTER — Ambulatory Visit (INDEPENDENT_AMBULATORY_CARE_PROVIDER_SITE_OTHER): Payer: Commercial Managed Care - HMO | Admitting: Radiology

## 2023-05-15 VITALS — BP 136/88

## 2023-05-15 DIAGNOSIS — N924 Excessive bleeding in the premenopausal period: Secondary | ICD-10-CM

## 2023-05-15 MED ORDER — NORETHINDRONE 0.35 MG PO TABS: 1.0000 | ORAL_TABLET | Freq: Every day | ORAL | 0 refills | Status: DC

## 2023-05-15 NOTE — Progress Notes (Signed)
   Jasmine Dominguez Jun 08, 1976 [id number]   History:  47 y.o. G4P4 presents with c/o heavy irregular menses. LMP 04/15/23, bled for 10 days before it stopped, very heavy. 3 previous cycles also irregular coming every 14 days but normal flow. Ultrasound last year showed adenomyosis otherwise was  normal. She opted for IUD placement and later cancelled her appt.  Gynecologic History No LMP recorded. (Menstrual status: Irregular Periods).   Contraception/Family planning: tubal ligation Sexually active: yes   Obstetric History OB History  Gravida Para Term Preterm AB Living  4 4       4   SAB IAB Ectopic Multiple Live Births               # Outcome Date GA Lbr Len/2nd Weight Sex Delivery Anes PTL Lv  4 Para           3 Para           2 Para           1 Para              The following portions of the patient's history were reviewed and updated as appropriate: allergies, past family history, past medical history, past social history, past surgical history, and problem list.  Review of Systems Pertinent items noted in HPI and remainder of comprehensive ROS otherwise negative.   Past medical history, past surgical history, family history and social history were all reviewed and documented in the EPIC chart.   Exam:  Vitals:   05/15/23 1354  BP: 136/88   There is no height or weight on file to calculate BMI.  General appearance:  Normal, obese Respiratory  Auscultation:  Clear without wheezing or rhonchi Cardiovascular  Auscultation:  Regular rate, without rubs, murmurs or gallops  Edema/varicosities:  Not grossly evident Abdominal  Soft,nontender, without masses, guarding or rebound.  Liver/spleen:  No organomegaly noted  Hernia:  None appreciated  Skin  Inspection:  Grossly normal Genitourinary   Inguinal/mons:  Normal without inguinal adenopathy  External genitalia:  Normal appearing vulva with no masses, tenderness, or lesions  BUS/Urethra/Skene's glands:   Normal without masses or exudate  Vagina:  Normal appearing with normal color and discharge, no lesions  Cervix:  Normal appearing without discharge or lesions  Uterus:  Normal in size, shape and contour.  Mobile, nontender  Adnexa/parametria:     Rt: Normal in size, without masses or tenderness.   Lt: Normal in size, without masses or tenderness.  Anus and perineum: Normal  Raynelle Fanning, CMA present for exam  Assessment/Plan:   1. Excessive bleeding in premenopausal period Does not want an IUD any longer, open to trying POPs - norethindrone (ORTHO MICRONOR) 0.35 MG tablet; Take 1 tablet (0.35 mg total) by mouth daily.  Dispense: 84 tablet; Refill: 0   Laverne Klugh B WHNP-BC 2:29 PM 05/15/2023

## 2023-06-12 ENCOUNTER — Encounter (INDEPENDENT_AMBULATORY_CARE_PROVIDER_SITE_OTHER): Payer: Self-pay

## 2023-07-10 ENCOUNTER — Encounter: Payer: Self-pay | Admitting: Internal Medicine

## 2023-07-10 ENCOUNTER — Ambulatory Visit (INDEPENDENT_AMBULATORY_CARE_PROVIDER_SITE_OTHER): Payer: Commercial Managed Care - HMO | Admitting: Internal Medicine

## 2023-07-10 VITALS — BP 130/88 | HR 78 | Temp 97.7°F | Wt 184.0 lb

## 2023-07-10 DIAGNOSIS — Z114 Encounter for screening for human immunodeficiency virus [HIV]: Secondary | ICD-10-CM

## 2023-07-10 DIAGNOSIS — Z0000 Encounter for general adult medical examination without abnormal findings: Secondary | ICD-10-CM | POA: Diagnosis not present

## 2023-07-10 DIAGNOSIS — D500 Iron deficiency anemia secondary to blood loss (chronic): Secondary | ICD-10-CM

## 2023-07-10 DIAGNOSIS — E559 Vitamin D deficiency, unspecified: Secondary | ICD-10-CM

## 2023-07-10 DIAGNOSIS — M5442 Lumbago with sciatica, left side: Secondary | ICD-10-CM

## 2023-07-10 DIAGNOSIS — I10 Essential (primary) hypertension: Secondary | ICD-10-CM

## 2023-07-10 DIAGNOSIS — Z23 Encounter for immunization: Secondary | ICD-10-CM

## 2023-07-10 DIAGNOSIS — Z1159 Encounter for screening for other viral diseases: Secondary | ICD-10-CM

## 2023-07-10 DIAGNOSIS — Z1231 Encounter for screening mammogram for malignant neoplasm of breast: Secondary | ICD-10-CM

## 2023-07-10 DIAGNOSIS — Z1211 Encounter for screening for malignant neoplasm of colon: Secondary | ICD-10-CM

## 2023-07-10 DIAGNOSIS — G8929 Other chronic pain: Secondary | ICD-10-CM

## 2023-07-10 LAB — VITAMIN D 25 HYDROXY (VIT D DEFICIENCY, FRACTURES): VITD: 22.34 ng/mL — ABNORMAL LOW (ref 30.00–100.00)

## 2023-07-10 LAB — CBC WITH DIFFERENTIAL/PLATELET
Basophils Absolute: 0 10*3/uL (ref 0.0–0.1)
Basophils Relative: 0.6 % (ref 0.0–3.0)
Eosinophils Absolute: 0.1 10*3/uL (ref 0.0–0.7)
Eosinophils Relative: 1.9 % (ref 0.0–5.0)
HCT: 40.1 % (ref 36.0–46.0)
Hemoglobin: 12.8 g/dL (ref 12.0–15.0)
Lymphocytes Relative: 23.1 % (ref 12.0–46.0)
Lymphs Abs: 1.4 10*3/uL (ref 0.7–4.0)
MCHC: 32 g/dL (ref 30.0–36.0)
MCV: 88.9 fl (ref 78.0–100.0)
Monocytes Absolute: 0.4 10*3/uL (ref 0.1–1.0)
Monocytes Relative: 6.4 % (ref 3.0–12.0)
Neutro Abs: 4.2 10*3/uL (ref 1.4–7.7)
Neutrophils Relative %: 68 % (ref 43.0–77.0)
Platelets: 282 10*3/uL (ref 150.0–400.0)
RBC: 4.51 Mil/uL (ref 3.87–5.11)
RDW: 14.3 % (ref 11.5–15.5)
WBC: 6.2 10*3/uL (ref 4.0–10.5)

## 2023-07-10 LAB — LIPID PANEL
Cholesterol: 150 mg/dL (ref 0–200)
HDL: 41.6 mg/dL (ref >39.00)
LDL Cholesterol: 81 mg/dL (ref 0–99)
NonHDL: 108.41
Total CHOL/HDL Ratio: 4
Triglycerides: 138 mg/dL (ref 0.0–149.0)
VLDL: 27.6 mg/dL (ref 0.0–40.0)

## 2023-07-10 LAB — COMPREHENSIVE METABOLIC PANEL
ALT: 26 U/L (ref 0–35)
AST: 23 U/L (ref 0–37)
Albumin: 4.1 g/dL (ref 3.5–5.2)
Alkaline Phosphatase: 91 U/L (ref 39–117)
BUN: 11 mg/dL (ref 6–23)
CO2: 28 mEq/L (ref 19–32)
Calcium: 9.2 mg/dL (ref 8.4–10.5)
Chloride: 101 mEq/L (ref 96–112)
Creatinine, Ser: 0.48 mg/dL (ref 0.40–1.20)
GFR: 112.18 mL/min (ref >60.00)
Glucose, Bld: 88 mg/dL (ref 70–99)
Potassium: 3.9 mEq/L (ref 3.5–5.1)
Sodium: 137 mEq/L (ref 135–145)
Total Bilirubin: 0.5 mg/dL (ref 0.2–1.2)
Total Protein: 7.8 g/dL (ref 6.0–8.3)

## 2023-07-10 LAB — TSH: TSH: 0.88 u[IU]/mL (ref 0.35–5.50)

## 2023-07-10 LAB — VITAMIN B12: Vitamin B-12: 684 pg/mL (ref 211–911)

## 2023-07-10 MED ORDER — VALSARTAN-HYDROCHLOROTHIAZIDE 80-12.5 MG PO TABS
1.0000 | ORAL_TABLET | Freq: Every day | ORAL | 1 refills | Status: DC
Start: 2023-07-10 — End: 2023-09-09

## 2023-07-10 MED ORDER — MELOXICAM 7.5 MG PO TABS: 7.5000 mg | ORAL_TABLET | Freq: Every day | ORAL | 0 refills | Status: DC

## 2023-07-10 MED ORDER — AMLODIPINE BESYLATE 5 MG PO TABS
ORAL_TABLET | ORAL | 1 refills | Status: DC
Start: 2023-07-10 — End: 2023-09-09

## 2023-07-10 NOTE — Addendum Note (Signed)
Addended by: Kern Reap B on: 07/10/2023 09:43 AM   Modules accepted: Orders

## 2023-07-10 NOTE — Progress Notes (Signed)
Established Patient Office Visit     CC/Reason for Visit: Annual preventive exam, discuss acute and chronic concerns.  HPI: Jasmine Dominguez is a 47 y.o. female who is coming in today for the above mentioned reasons. Past Medical History is significant for: Hypertension, morbid obesity, GERD.  I have not seen her in close to 2 years.  Her blood pressure has not been well-controlled.  At home her systolics are in the high 130s to 140s with diastolics in the high 80s.  She has been experiencing some left lower back pain with left-sided sciatica.  She is overdue for flu and COVID vaccines, is overdue for breast and colon cancer screening.  She visited her GYN recently.  She has routine eye care.  Sees a dentist yearly in [place].   Past Medical/Surgical History: Past Medical History:  Diagnosis Date   Chronic headaches    Hypertension    NSVD (normal spontaneous vaginal delivery)     Past Surgical History:  Procedure Laterality Date   CESAREAN SECTION     X 3... severe preeclampsia 29 wks   TUBAL LIGATION      Social History:  reports that she has never smoked. She has never used smokeless tobacco. She reports current alcohol use. She reports that she does not use drugs.  Allergies: No Known Allergies  Family History:  Family History  Problem Relation Age of Onset   Diabetes Mother    Hypertension Mother    Diabetes Father    Hypertension Father    Cancer Father        Prostatte cancer    Early death Father    Heart disease Sister    Asthma Sister    Diabetes Sister    Heart disease Brother    Diabetes Brother    Early death Brother    Hypertension Brother    Stroke Brother      Current Outpatient Medications:    Iron, Ferrous Sulfate, 325 (65 Fe) MG TABS, Take 325 mg by mouth in the morning and at bedtime., Disp: 30 tablet, Rfl:    meloxicam (MOBIC) 7.5 MG tablet, Take 1 tablet (7.5 mg total) by mouth daily., Disp: 30 tablet, Rfl: 0   Multiple Vitamin  (MULTIVITAMIN) tablet, Take 1 tablet by mouth daily., Disp: , Rfl:    norethindrone (ORTHO MICRONOR) 0.35 MG tablet, Take 1 tablet (0.35 mg total) by mouth daily., Disp: 84 tablet, Rfl: 0   valsartan-hydrochlorothiazide (DIOVAN HCT) 80-12.5 MG tablet, Take 1 tablet by mouth daily., Disp: 90 tablet, Rfl: 1   amLODipine (NORVASC) 5 MG tablet, TAKE 1 TABLET(5 MG) BY MOUTH DAILY, Disp: 90 tablet, Rfl: 1  Review of Systems:  Negative unless indicated in HPI.   Physical Exam: Vitals:   07/10/23 0854  BP: 130/88  Pulse: 78  Temp: 97.7 F (36.5 C)  TempSrc: Oral  SpO2: 97%  Weight: 184 lb (83.5 kg)    Body mass index is 35.94 kg/m.   Physical Exam Vitals reviewed.  Constitutional:      General: She is not in acute distress.    Appearance: Normal appearance. She is not ill-appearing, toxic-appearing or diaphoretic.  HENT:     Head: Normocephalic.     Right Ear: Tympanic membrane, ear canal and external ear normal. There is no impacted cerumen.     Left Ear: Tympanic membrane, ear canal and external ear normal. There is no impacted cerumen.     Nose: Nose normal.  Mouth/Throat:     Mouth: Mucous membranes are moist.     Pharynx: Oropharynx is clear. No oropharyngeal exudate or posterior oropharyngeal erythema.  Eyes:     General: No scleral icterus.       Right eye: No discharge.        Left eye: No discharge.     Conjunctiva/sclera: Conjunctivae normal.     Pupils: Pupils are equal, round, and reactive to light.  Neck:     Vascular: No carotid bruit.  Cardiovascular:     Rate and Rhythm: Normal rate and regular rhythm.     Pulses: Normal pulses.     Heart sounds: Normal heart sounds.  Pulmonary:     Effort: Pulmonary effort is normal. No respiratory distress.     Breath sounds: Normal breath sounds.  Abdominal:     General: Abdomen is flat. Bowel sounds are normal.     Palpations: Abdomen is soft.  Musculoskeletal:        General: Normal range of motion.      Cervical back: Normal range of motion.  Skin:    General: Skin is warm and dry.  Neurological:     General: No focal deficit present.     Mental Status: She is alert and oriented to person, place, and time. Mental status is at baseline.  Psychiatric:        Mood and Affect: Mood normal.        Behavior: Behavior normal.        Thought Content: Thought content normal.        Judgment: Judgment normal.     Flowsheet Row Office Visit from 07/10/2023 in [facility]  PHQ-9 Total Score 1        Impression and Plan:  Encounter for preventive health examination  Primary hypertension -     CBC with Differential/Platelet; Future -     Comprehensive metabolic panel; Future -     Lipid panel; Future -     amLODIPine Besylate; TAKE 1 TABLET(5 MG) BY MOUTH DAILY  Dispense: 90 tablet; Refill: 1 -     Valsartan-hydroCHLOROthiazide; Take 1 tablet by mouth daily.  Dispense: 90 tablet; Refill: 1  Iron deficiency anemia due to chronic blood loss  Vitamin D deficiency -     VITAMIN D 25 Hydroxy (Vit-D Deficiency, Fractures); Future  Morbid obesity (HCC) -     TSH; Future -     Vitamin B12; Future  Chronic left-sided low back pain with left-sided sciatica -     Meloxicam; Take 1 tablet (7.5 mg total) by mouth daily.  Dispense: 30 tablet; Refill: 0  Encounter for hepatitis C screening test for low risk patient -     Hepatitis C antibody; Future  Encounter for screening for HIV -     HIV Antibody (routine testing w rflx); Future   -Recommend routine eye and dental care. -Healthy lifestyle discussed in detail. -Labs to be updated today. -Prostate cancer screening: N/A Health Maintenance  Topic Date Due   HIV Screening  Never done   Hepatitis C Screening  Never done   Colon Cancer Screening  Never done   Flu Shot  06/13/2023   COVID-19 Vaccine (4 - 2023-24 season) 07/26/2023*   Pap Smear  07/09/2025   DTaP/Tdap/Td vaccine (3 - Td or Tdap) 06/24/2031    HPV Vaccine  Aged Out  *Topic was postponed. The date shown is not the original due date.     -  Flu vaccine administered in office today, will consider getting COVID at a later date. -Mammogram and GI referral for colonoscopy placed. -Blood pressure remains uncontrolled. -For her back advised 10 days of meloxicam, icing, back stretches.  If no improvement will consider referral to physical therapy.    Chaya Jan, MD  Primary Care at [facility]

## 2023-07-11 ENCOUNTER — Other Ambulatory Visit: Payer: Self-pay | Admitting: *Deleted

## 2023-07-11 DIAGNOSIS — E559 Vitamin D deficiency, unspecified: Secondary | ICD-10-CM

## 2023-07-11 LAB — HIV ANTIBODY (ROUTINE TESTING W REFLEX): HIV 1&2 Ab, 4th Generation: NONREACTIVE

## 2023-07-11 LAB — HEPATITIS C ANTIBODY: Hepatitis C Ab: NONREACTIVE

## 2023-08-06 ENCOUNTER — Other Ambulatory Visit: Payer: Self-pay | Admitting: Radiology

## 2023-08-06 DIAGNOSIS — N924 Excessive bleeding in the premenopausal period: Secondary | ICD-10-CM

## 2023-08-06 NOTE — Telephone Encounter (Signed)
Med refill request: Micronor Last office visit 05/15/23 for problem with Jami Next AEX: none scheduled Last MMG (if hormonal med) none Refill sent to provider for approval.

## 2023-09-09 ENCOUNTER — Encounter: Payer: Self-pay | Admitting: Internal Medicine

## 2023-09-09 ENCOUNTER — Ambulatory Visit (INDEPENDENT_AMBULATORY_CARE_PROVIDER_SITE_OTHER): Payer: Managed Care, Other (non HMO) | Admitting: Internal Medicine

## 2023-09-09 VITALS — BP 149/99 | HR 76 | Temp 98.3°F | Wt 187.0 lb

## 2023-09-09 DIAGNOSIS — M5442 Lumbago with sciatica, left side: Secondary | ICD-10-CM

## 2023-09-09 DIAGNOSIS — G8929 Other chronic pain: Secondary | ICD-10-CM

## 2023-09-09 DIAGNOSIS — I10 Essential (primary) hypertension: Secondary | ICD-10-CM

## 2023-09-09 MED ORDER — AMLODIPINE BESYLATE 5 MG PO TABS
ORAL_TABLET | ORAL | 1 refills | Status: AC
Start: 2023-09-09 — End: ?

## 2023-09-09 MED ORDER — VALSARTAN-HYDROCHLOROTHIAZIDE 80-12.5 MG PO TABS
1.0000 | ORAL_TABLET | Freq: Every day | ORAL | 1 refills | Status: AC
Start: 2023-09-09 — End: ?

## 2023-09-09 MED ORDER — MELOXICAM 7.5 MG PO TABS
7.5000 mg | ORAL_TABLET | Freq: Every day | ORAL | 0 refills | Status: DC
Start: 2023-09-09 — End: 2023-10-08

## 2023-09-09 NOTE — Assessment & Plan Note (Signed)
Discussed healthy lifestyle, including increased physical activity and better food choices to promote weight loss.  

## 2023-09-09 NOTE — Assessment & Plan Note (Signed)
Unsurprisingly not well-controlled as she has not been adherent to medical therapy.  I will send refills of both antihypertensives today.  Return in 3 months for follow-up.

## 2023-09-09 NOTE — Progress Notes (Signed)
Established Patient Office Visit     CC/Reason for Visit: Follow-up chronic conditions  HPI: Jasmine Dominguez is a 47 y.o. female who is coming in today for the above mentioned reasons. Past Medical History is significant for: Hypertension, morbid obesity, GERD.  Has had continued issues with sciatica and is requesting a refill of meloxicam.  She has been offered physical therapy but she declines and states that she has been doing workouts at home that help.  For reasons that are not clear to me she is taking amlodipine but not Diovan HCT.  I will send refills of both and today.   Past Medical/Surgical History: Past Medical History:  Diagnosis Date   Chronic headaches    Hypertension    NSVD (normal spontaneous vaginal delivery)     Past Surgical History:  Procedure Laterality Date   CESAREAN SECTION     X 3... severe preeclampsia 29 wks   TUBAL LIGATION      Social History:  reports that she has never smoked. She has never used smokeless tobacco. She reports current alcohol use. She reports that she does not use drugs.  Allergies: No Known Allergies  Family History:  Family History  Problem Relation Age of Onset   Diabetes Mother    Hypertension Mother    Diabetes Father    Hypertension Father    Cancer Father        Prostatte cancer    Early death Father    Heart disease Sister    Asthma Sister    Diabetes Sister    Heart disease Brother    Diabetes Brother    Early death Brother    Hypertension Brother    Stroke Brother      Current Outpatient Medications:    Iron, Ferrous Sulfate, 325 (65 Fe) MG TABS, Take 325 mg by mouth in the morning and at bedtime., Disp: 30 tablet, Rfl:    Multiple Vitamin (MULTIVITAMIN) tablet, Take 1 tablet by mouth daily., Disp: , Rfl:    norethindrone (MICRONOR) 0.35 MG tablet, TAKE 1 TABLET(0.35 MG) BY MOUTH DAILY, Disp: 28 tablet, Rfl: 0   amLODipine (NORVASC) 5 MG tablet, TAKE 1 TABLET(5 MG) BY MOUTH DAILY, Disp:  90 tablet, Rfl: 1   meloxicam (MOBIC) 7.5 MG tablet, Take 1 tablet (7.5 mg total) by mouth daily., Disp: 30 tablet, Rfl: 0   valsartan-hydrochlorothiazide (DIOVAN HCT) 80-12.5 MG tablet, Take 1 tablet by mouth daily., Disp: 90 tablet, Rfl: 1  Review of Systems:  Negative unless indicated in HPI.   Physical Exam: Vitals:   09/09/23 0942 09/09/23 0945  BP: (!) 138/90 (!) 149/99  Pulse: 76   Temp: 98.3 F (36.8 C)   TempSrc: Oral   SpO2: 98%   Weight: 187 lb (84.8 kg)     Body mass index is 36.52 kg/m.   Physical Exam Vitals reviewed.  Constitutional:      Appearance: Normal appearance.  HENT:     Head: Normocephalic and atraumatic.  Eyes:     Conjunctiva/sclera: Conjunctivae normal.     Pupils: Pupils are equal, round, and reactive to light.  Cardiovascular:     Rate and Rhythm: Normal rate and regular rhythm.  Pulmonary:     Effort: Pulmonary effort is normal.     Breath sounds: Normal breath sounds.  Skin:    General: Skin is warm and dry.  Neurological:     General: No focal deficit present.     Mental Status: She  is alert and oriented to person, place, and time.  Psychiatric:        Mood and Affect: Mood normal.        Behavior: Behavior normal.        Thought Content: Thought content normal.        Judgment: Judgment normal.      Impression and Plan:  Hypertension, unspecified type Assessment & Plan: Unsurprisingly not well-controlled as she has not been adherent to medical therapy.  I will send refills of both antihypertensives today.  Return in 3 months for follow-up.   Morbid obesity (HCC) Assessment & Plan: -Discussed healthy lifestyle, including increased physical activity and better food choices to promote weight loss.    Primary hypertension Assessment & Plan: Unsurprisingly not well-controlled as she has not been adherent to medical therapy.  I will send refills of both antihypertensives today.  Return in 3 months for  follow-up.  Orders: -     amLODIPine Besylate; TAKE 1 TABLET(5 MG) BY MOUTH DAILY  Dispense: 90 tablet; Refill: 1 -     Valsartan-hydroCHLOROthiazide; Take 1 tablet by mouth daily.  Dispense: 90 tablet; Refill: 1  Chronic left-sided low back pain with left-sided sciatica -     Meloxicam; Take 1 tablet (7.5 mg total) by mouth daily.  Dispense: 30 tablet; Refill: 0  -Declines PT but will continue to work out at home as she has been.   Time spent:32 minutes reviewing chart, interviewing and examining patient and formulating plan of care.     Chaya Jan, MD [facility]

## 2023-09-10 ENCOUNTER — Other Ambulatory Visit: Payer: Self-pay

## 2023-09-10 DIAGNOSIS — N924 Excessive bleeding in the premenopausal period: Secondary | ICD-10-CM

## 2023-09-10 NOTE — Telephone Encounter (Signed)
Med refill request: POPs Last AEX: 2015 Next AEX: nothing scheduled Last MMG (if hormonal med): none seen Refill authorized: rx pend.  Msg sent to appt desk to contact pt to schedule appt for AEX.

## 2023-09-12 NOTE — Telephone Encounter (Signed)
09/11/2023: Jennye Moccasin, CMA Left message for patient to call and schedule appointment."

## 2023-09-17 NOTE — Telephone Encounter (Signed)
Jasmine Dominguez, CMA Message left for patient to call and schedule. Patient has not called back.  Refill request refused Routing to provider for final review and closing encounter.

## 2023-10-02 IMAGING — US US PELVIS COMPLETE WITH TRANSVAGINAL
1 series · 13 of 25 positions shown · non-contrast
Comparison: None

CLINICAL DATA: Menorrhagia, irregular menses for 3 months, unknown
LMP, history Caesarean section



[Series 1: us pelvis complete with transvaginal · 0.25mm/px · 13 of 43 slices shown]
[im 1/43]
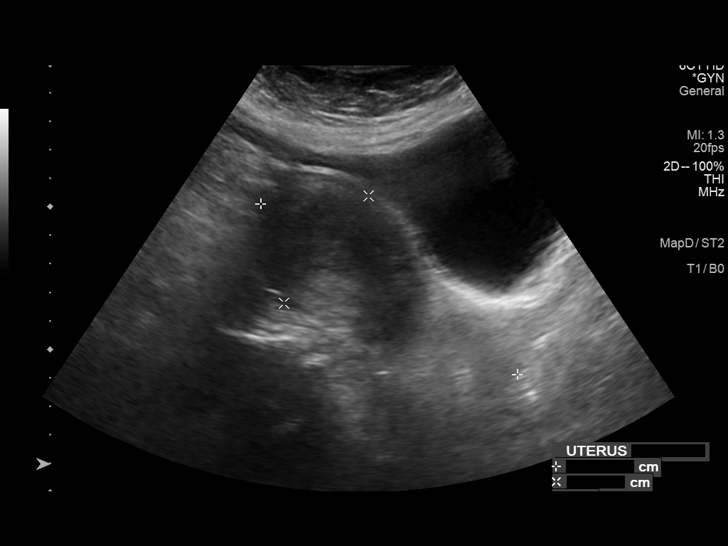
[im 4/43]
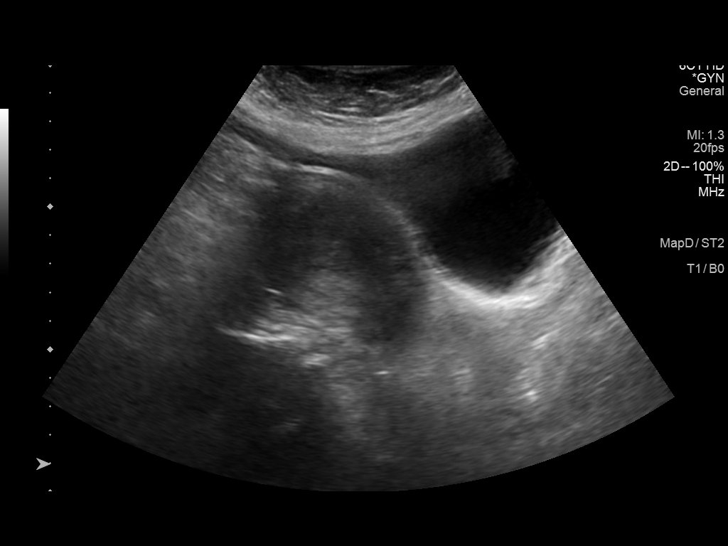
[im 8/43]
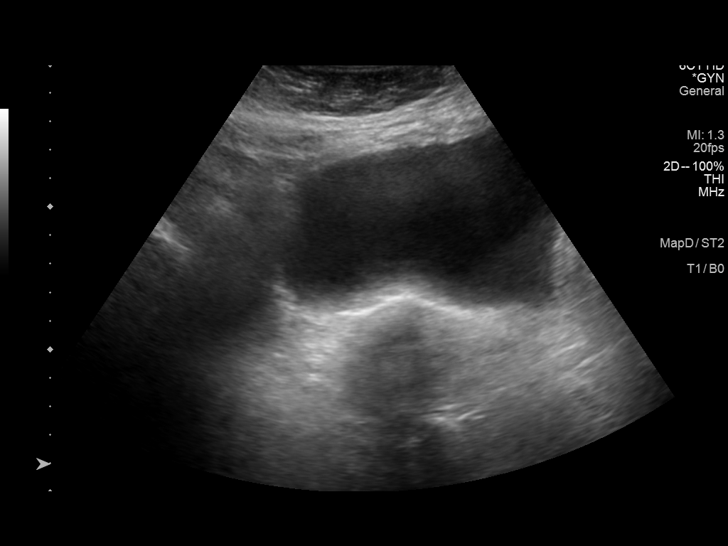
[im 11/43]
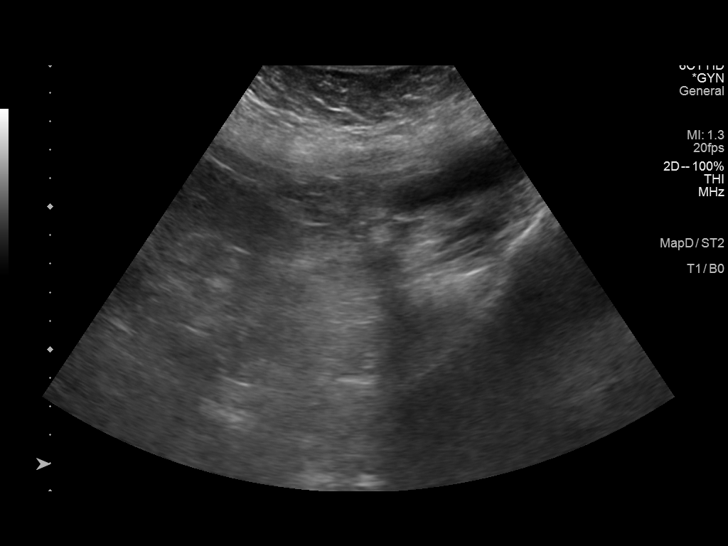
[im 15/43]
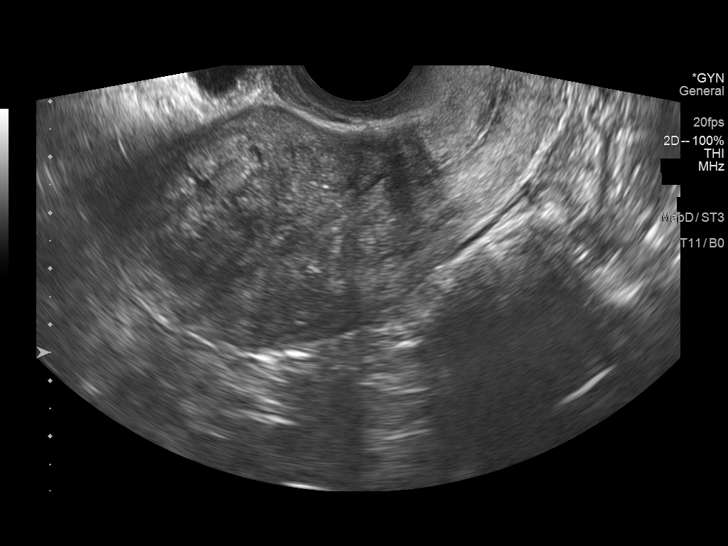
[im 18/43]
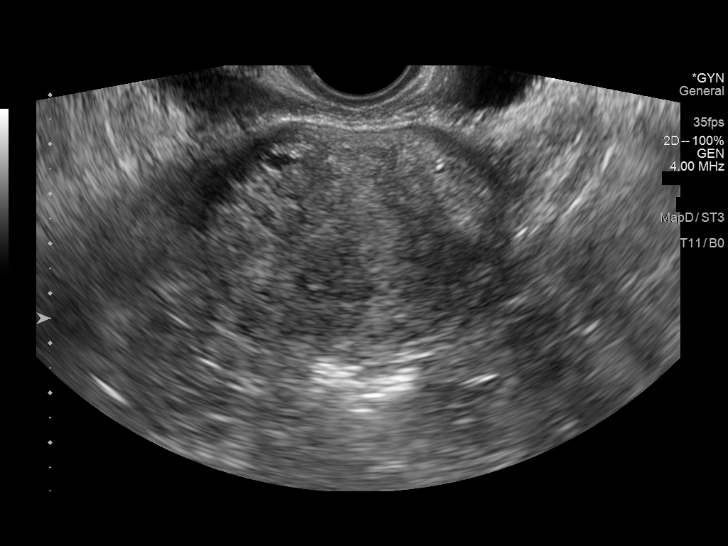
[im 22/43]
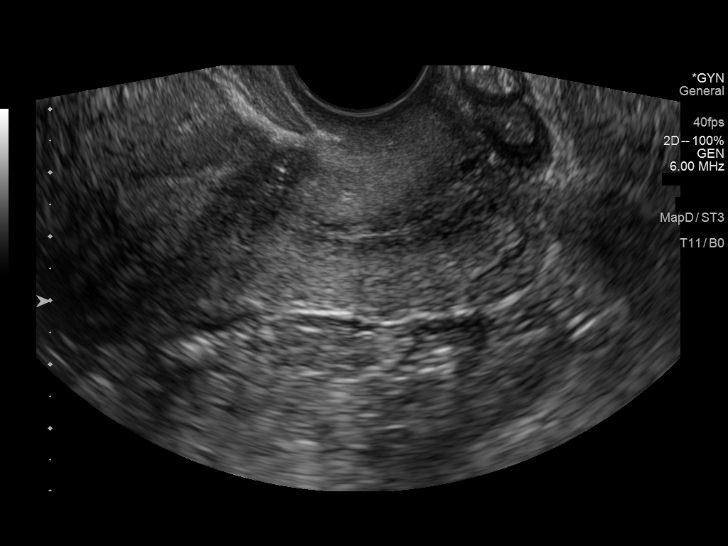
[im 25/43]
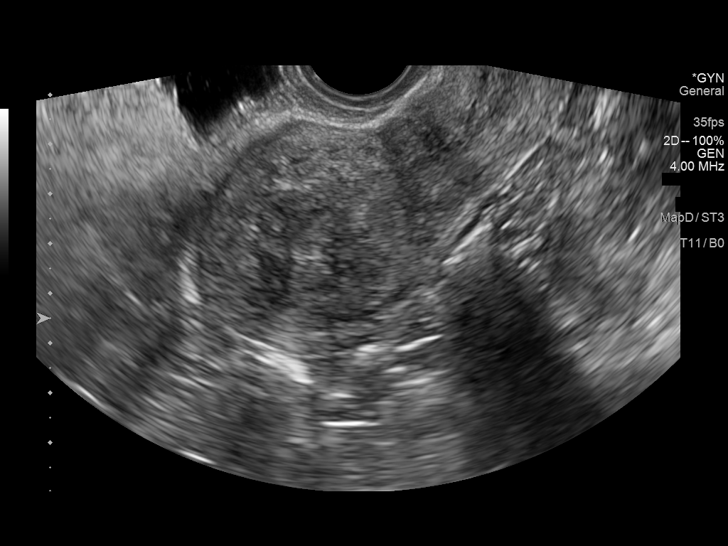
[im 29/43]
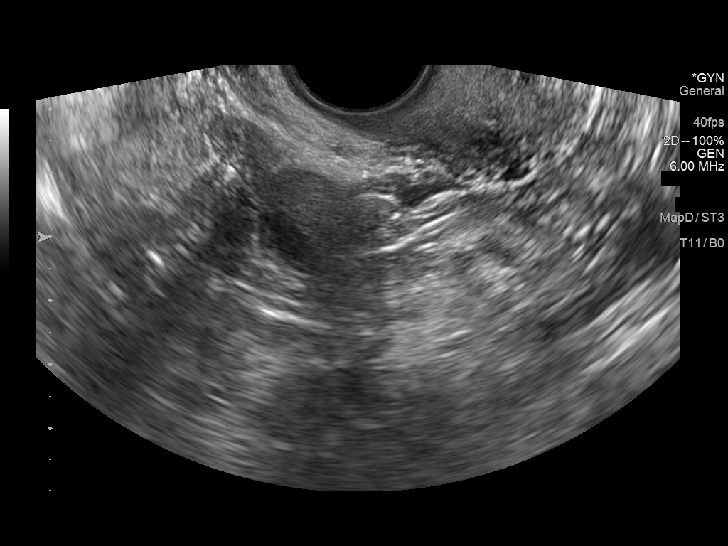
[im 32/43]
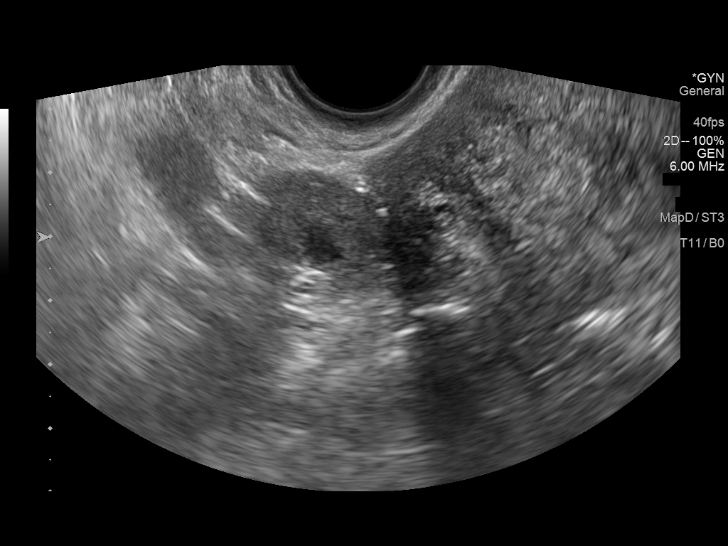
[im 36/43]
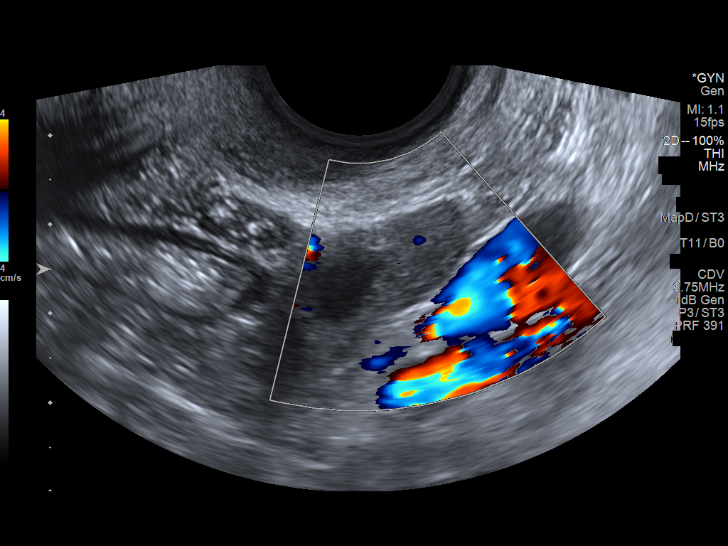
[im 39/43]
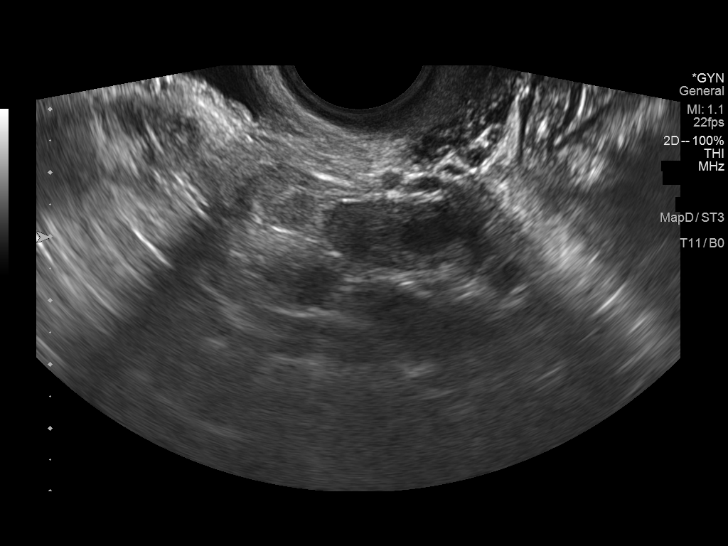
[im 43/43]
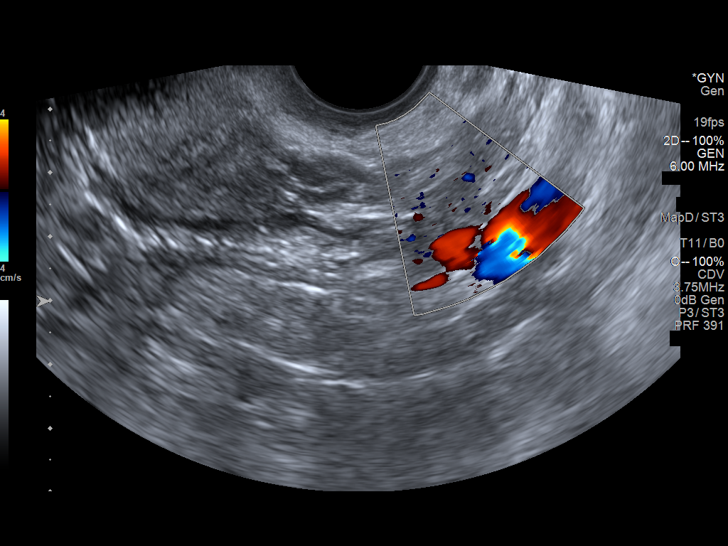

[13 of 25 positions shown; findings below may reference images not displayed]

FINDINGS: Uterus

Measurements: 10.8 x 4.8 x 5.2 cm = volume: 167 ML. Anteverted.
Heterogeneous myometrium with ill-defined endometrial complex,
cannot exclude adenomyosis. No discrete mass.

Endometrium

Thickness: 8 mm.  Poorly marginated.  No discrete mass or fluid.

Right ovary

Measurements: 2.1 x 1.8 x 2.0 cm = volume: 3.7 mL. Normal morphology
without mass

Left ovary

Measurements: 2.6 x 0.9 x 2.4 cm = volume: 3.0 mL. Normal morphology
without mass

Other findings

No free pelvic fluid.  No adnexal masses.
IMPRESSION: Heterogeneous myometrium with ill-defined endometrial complex,
cannot exclude adenomyosis.

Remainder of exam unremarkable.

## 2023-10-08 ENCOUNTER — Other Ambulatory Visit: Payer: Self-pay | Admitting: Internal Medicine

## 2023-10-08 DIAGNOSIS — G8929 Other chronic pain: Secondary | ICD-10-CM
